# Patient Record
Sex: Male | Born: 1995 | Race: Black or African American | Hispanic: No | Marital: Single | Smoking: Never smoker
Health system: Southern US, Community
[De-identification: ages and names within clinical notes are randomized; demographics above are authoritative.]

## PROBLEM LIST (undated history)

## (undated) DIAGNOSIS — J189 Pneumonia, unspecified organism: Secondary | ICD-10-CM

## (undated) HISTORY — PX: APPENDECTOMY: SHX54

---
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
## 2016-01-28 ENCOUNTER — Ambulatory Visit (INDEPENDENT_AMBULATORY_CARE_PROVIDER_SITE_OTHER): Payer: Medicaid Other

## 2016-01-28 ENCOUNTER — Ambulatory Visit (HOSPITAL_COMMUNITY)
Admission: EM | Admit: 2016-01-28 | Discharge: 2016-01-28 | Disposition: A | Discharge status: Home / Self Care | Payer: Medicaid Other | Attending: Emergency Medicine | Admitting: Emergency Medicine

## 2016-01-28 ENCOUNTER — Encounter (HOSPITAL_COMMUNITY): Payer: Self-pay | Admitting: Emergency Medicine

## 2016-01-28 DIAGNOSIS — R1111 Vomiting without nausea: Secondary | ICD-10-CM | POA: Diagnosis not present

## 2016-01-28 DIAGNOSIS — R141 Gas pain: Secondary | ICD-10-CM

## 2016-01-28 DIAGNOSIS — R1110 Vomiting, unspecified: Secondary | ICD-10-CM

## 2016-01-28 MED ORDER — METOCLOPRAMIDE HCL 10 MG PO TABS: 10.0000 mg | ORAL_TABLET | Freq: Three times a day (TID) | ORAL | 0 refills | Status: DC

## 2016-01-28 NOTE — ED Provider Notes (Signed)
CSN: [id number]     Arrival date & time 01/28/16  1230 History   First MD Initiated Contact with Patient 01/28/16 1442     No chief complaint on file.  (Consider location/radiation/quality/duration/timing/severity/associated sxs/prior Treatment) 20 year old male states for the past 2 days he has had sudden regurgitation of food in which she also causes vomiting proximal mid 5 minutes after eating a meal. After this occurs he has pain in the mid and left chest that radiates to the upper abdomen. This last for several minutes before gradually resolving. This has happened several times in the past 2 days. He states he had does not otherwise feel sick. No fatigue, malaise, fever, nausea.      Past Medical History:  Diagnosis Date  . Pneumonia    Past Surgical History:  Procedure Laterality Date  . APPENDECTOMY     Family History  Problem Relation Age of Onset  . Hypertension Mother    Social History  Substance Use Topics  . Smoking status: Never Smoker  . Smokeless tobacco: Not on file  . Alcohol use No    Review of Systems  Constitutional: Negative.   Respiratory: Negative for cough and shortness of breath.   Cardiovascular: Positive for chest pain.  Gastrointestinal: Positive for abdominal pain. Negative for abdominal distention, constipation and nausea.       As per history of present illness States last bowel movement was 2:00 morning.  Genitourinary: Negative.   Musculoskeletal: Negative.   Skin: Negative.   Neurological: Positive for headaches.    Allergies  Review of patient's allergies indicates no known allergies.  Home Medications   Prior to Admission medications   Medication Sig Start Date End Date Taking? Authorizing Provider  cetirizine (ZYRTEC) 10 MG tablet Take 1 tablet (10 mg total) by mouth daily. One tab daily for allergies 07/05/13   Linna HoffJames D Kindl, MD  fluticasone [facility](FLONASE) 50 MCG/ACT nasal spray Place 1 spray into both nostrils 2 (two) times daily.  07/05/13   Linna HoffJames D Kindl, MD  ibuprofen (ADVIL,MOTRIN) 600 MG tablet Take 1 tablet (600 mg total) by mouth every 6 (six) hours as needed for pain. 12/28/12   Earley FavorGail Schulz, NP  metoCLOPramide (REGLAN) 10 MG tablet Take 1 tablet (10 mg total) by mouth 3 (three) times daily before meals. 01/28/16   Hayden Rasmussenavid Sha Burling, NP   Meds Ordered and Administered this Visit  Medications - No data to display  BP 122/67 (BP Location: Left Arm)   Pulse 70   Temp 98.2 F (36.8 C) (Oral)   Resp 14   SpO2 99%  No data found.   Physical Exam  Constitutional: He is oriented to person, place, and time. He appears well-developed and well-nourished. No distress.  HENT:  Head: Normocephalic and atraumatic.  Eyes: EOM are normal.  Neck: Normal range of motion. Neck supple.  Cardiovascular: Normal rate, regular rhythm and normal heart sounds.   Pulmonary/Chest: Effort normal and breath sounds normal. No respiratory distress. He has no wheezes.  Abdominal: He exhibits no distension. There is no rebound and no guarding. No hernia.  Abdomen flat, soft but firm. Bowel sounds are normal. No distention. Percusses dull to flat in most areas. Tympanic in the high epigastrium. No rebound or guarding there is some generalized tenderness. More specific tenderness I epigastrium.  Musculoskeletal: He exhibits no edema.  Neurological: He is alert and oriented to person, place, and time. He exhibits normal muscle tone.  Skin: Skin is warm and dry.  Psychiatric: He  has a normal mood and affect.  Nursing note and vitals reviewed.   Urgent Care Course   Clinical Course    Procedures (including critical care time)  Labs Review Labs Reviewed - No data to display  Imaging Review Dg Abd 2 Views  Result Date: 01/28/2016 CLINICAL DATA:  Vomiting EXAM: ABDOMEN - 2 VIEW COMPARISON:  08/14/2009 FINDINGS: Nonobstructive bowel gas pattern. No evidence of free air under the diaphragm on the upright view. Visualized osseous structures are  within normal limits. IMPRESSION: No evidence of small bowel obstruction or free air. Electronically Signed   By: Charline BillsSriyesh  Krishnan M.D.   On: 01/28/2016 15:14     Visual Acuity Review  Right Eye Distance:   Left Eye Distance:   Bilateral Distance:    Right Eye Near:   Left Eye Near:    Bilateral Near:         MDM   1. Non-intractable vomiting without nausea, unspecified vomiting type   2. Regurgitation of food   3. Abdominal gas pain    The reason for regurgitation of food soon after eating is uncertain. He may be having a partial blockage or narrowing in your upper esophageal and stomach tract. Until you see the gastroenterologist primarily drink fluids and when you eat most the soft foods in small amounts. Recommend not eating steak, tough meats or large meals. Must eat a small amount at a time allow it to digest wait 30 minutes to an hour and then you may eat a little more later. Take the medication is directed. If this continues and you have increased abdominal pain and unable to hold down food before seeing the gastroenterologist you should go to the emergency department. We do not have any additional testing here to help resolve her problem. In regard to the x-ray above there is moderate amount of stool and gas in the large bowel. The area of tenderness in the abdomen coincides with the location of the gas on x-ray. Patient will likely need swallowing function studies or endoscopy.    Hayden Rasmussenavid Brizza Nathanson, NP 01/28/16 [phone]

## 2016-01-28 NOTE — ED Triage Notes (Signed)
For 2 days, patient reports food will not stay down, patient reports vomiting after eating.  Patient denies any other issue: does not feel bad, gets hungry, starts eating and then has to vomit and then feels chest tighten.  Patient is holding down liquids.  Normal bm around 2:00 am

## 2016-01-28 NOTE — Discharge Instructions (Signed)
The reason for regurgitation of food soon after eating is uncertain. He may be having a partial blockage or narrowing in your upper esophageal and stomach tract. Until you see the gastroenterologist primarily drink fluids and when you eat most the soft foods in small amounts. Recommend not eating steak, tough meats or large meals. Must eat a small amount at a time allow it to digest wait 30 minutes to an hour and then you may eat a little more later. Take the medication is directed. If this continues and you have increased abdominal pain and unable to hold down food before seeing the gastroenterologist you should go to the emergency department. We do not have any additional testing here to help resolve her problem.

## 2017-03-29 ENCOUNTER — Other Ambulatory Visit: Payer: Self-pay

## 2017-03-29 ENCOUNTER — Ambulatory Visit (HOSPITAL_COMMUNITY)
Admission: EM | Admit: 2017-03-29 | Discharge: 2017-03-29 | Disposition: A | Discharge status: Home / Self Care | Payer: Self-pay | Attending: Family Medicine | Admitting: Family Medicine

## 2017-03-29 ENCOUNTER — Encounter (HOSPITAL_COMMUNITY): Payer: Self-pay | Admitting: Emergency Medicine

## 2017-03-29 DIAGNOSIS — H538 Other visual disturbances: Secondary | ICD-10-CM

## 2017-03-29 LAB — GLUCOSE, CAPILLARY: Glucose-Capillary: 80 mg/dL (ref 65–99)

## 2017-03-29 NOTE — ED Triage Notes (Signed)
Pt c/o eye pain and headache for ac ouple weeks, no redness or drainage noted. Pt states it will get blury at times. At the moment no issues with vision.

## 2017-03-29 NOTE — Discharge Instructions (Signed)
I recommend that you see an ophthalmologist for a thorough eye exam.  Your blood sugar today was normal.

## 2017-04-02 NOTE — ED Provider Notes (Signed)
  [facility]   [id number] 03/29/17 Arrival Time: 1408  ASSESSMENT & PLAN:  1. Blurry vision, right eye    Is not currently experiencing symptoms. Unsure of exact etiology. At this time I do not feel this is anything dangerous but I do recommend that he first receive a prompt and thorough eye exam from an ophthalmologist. He reports that he will schedule. If any acute worsening he will proceed to the ED for evaluation.  Reviewed expectations re: course of current medical issues. Questions answered. Outlined signs and symptoms indicating need for more acute intervention. Patient verbalized understanding. After Visit Summary given.   SUBJECTIVE: History from: patient. Jimmy Powers is a 22 y.o. male who presents with complaint of on and off blurry vision of his R eye. Occasional frontal headache associated but not always paired with blurry vision. Does not wear corrective lenses. Vision currently normal. No eye pain, redness, or drainage. Symptoms infrequent over the past 2 weeks. When present may last several minutes then resolve. No new medications.  ROS: As per HPI.   OBJECTIVE:  Vitals:   03/29/17 1537  BP: 120/63  Pulse: 84  Resp: 16  Temp: 98.6 F (37 C)  SpO2: 100%    General appearance: alert; no distress Eyes: PERRLA; EOMI; conjunctiva normal HENT: normocephalic; atraumatic; TMs normal; nasal mucosa normal; oral mucosa normal Neck: supple  Lungs: clear to auscultation bilaterally Heart: regular rate and rhythm Extremities: no cyanosis or edema; symmetrical with no gross deformities Skin: warm and dry Psychological: alert and cooperative; normal mood and affect  No Known Allergies  Past Medical History:  Diagnosis Date  . Pneumonia    Social History   Socioeconomic History  . Marital status: Single    Spouse name: Not on file  . Number of children: Not on file  . Years of education: Not on file  . Highest education level: Not on file  Social  Needs  . Financial resource strain: Not on file  . Food insecurity - worry: Not on file  . Food insecurity - inability: Not on file  . Transportation needs - medical: Not on file  . Transportation needs - non-medical: Not on file  Occupational History  . Not on file  Tobacco Use  . Smoking status: Never Smoker  Substance and Sexual Activity  . Alcohol use: No  . Drug use: No  . Sexual activity: No  Other Topics Concern  . Not on file  Social History Narrative  . Not on file   Family History  Problem Relation Age of Onset  . Hypertension Mother    Past Surgical History:  Procedure Laterality Date  . Christy GentlesAPPENDECTOMY       Jimmy Beagley, MD 04/02/17 [phone]

## 2018-03-24 IMAGING — DX DG ABDOMEN 2V
2 series · 2 of 2 positions shown · non-contrast
Comparison: 08/14/2009

CLINICAL DATA: Vomiting

EXAM:
ABDOMEN - 2 VIEW

[abdomen erect]
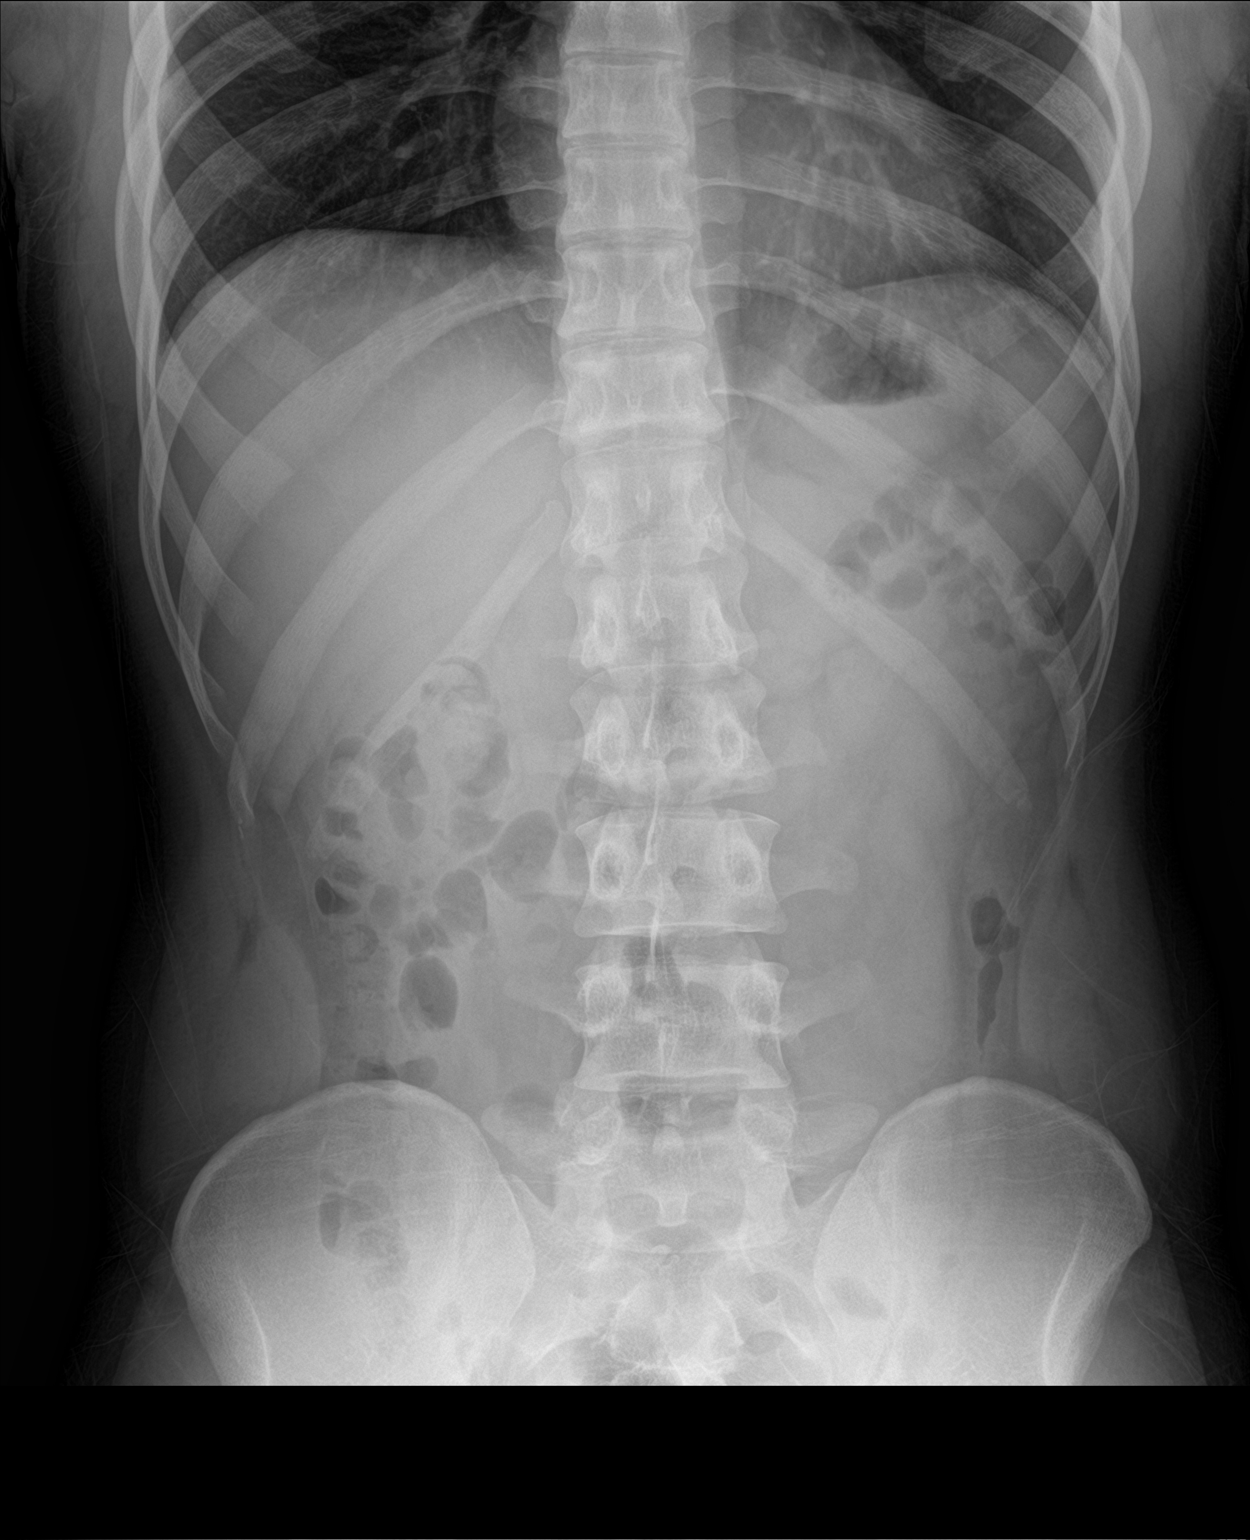

[abdomen supine]
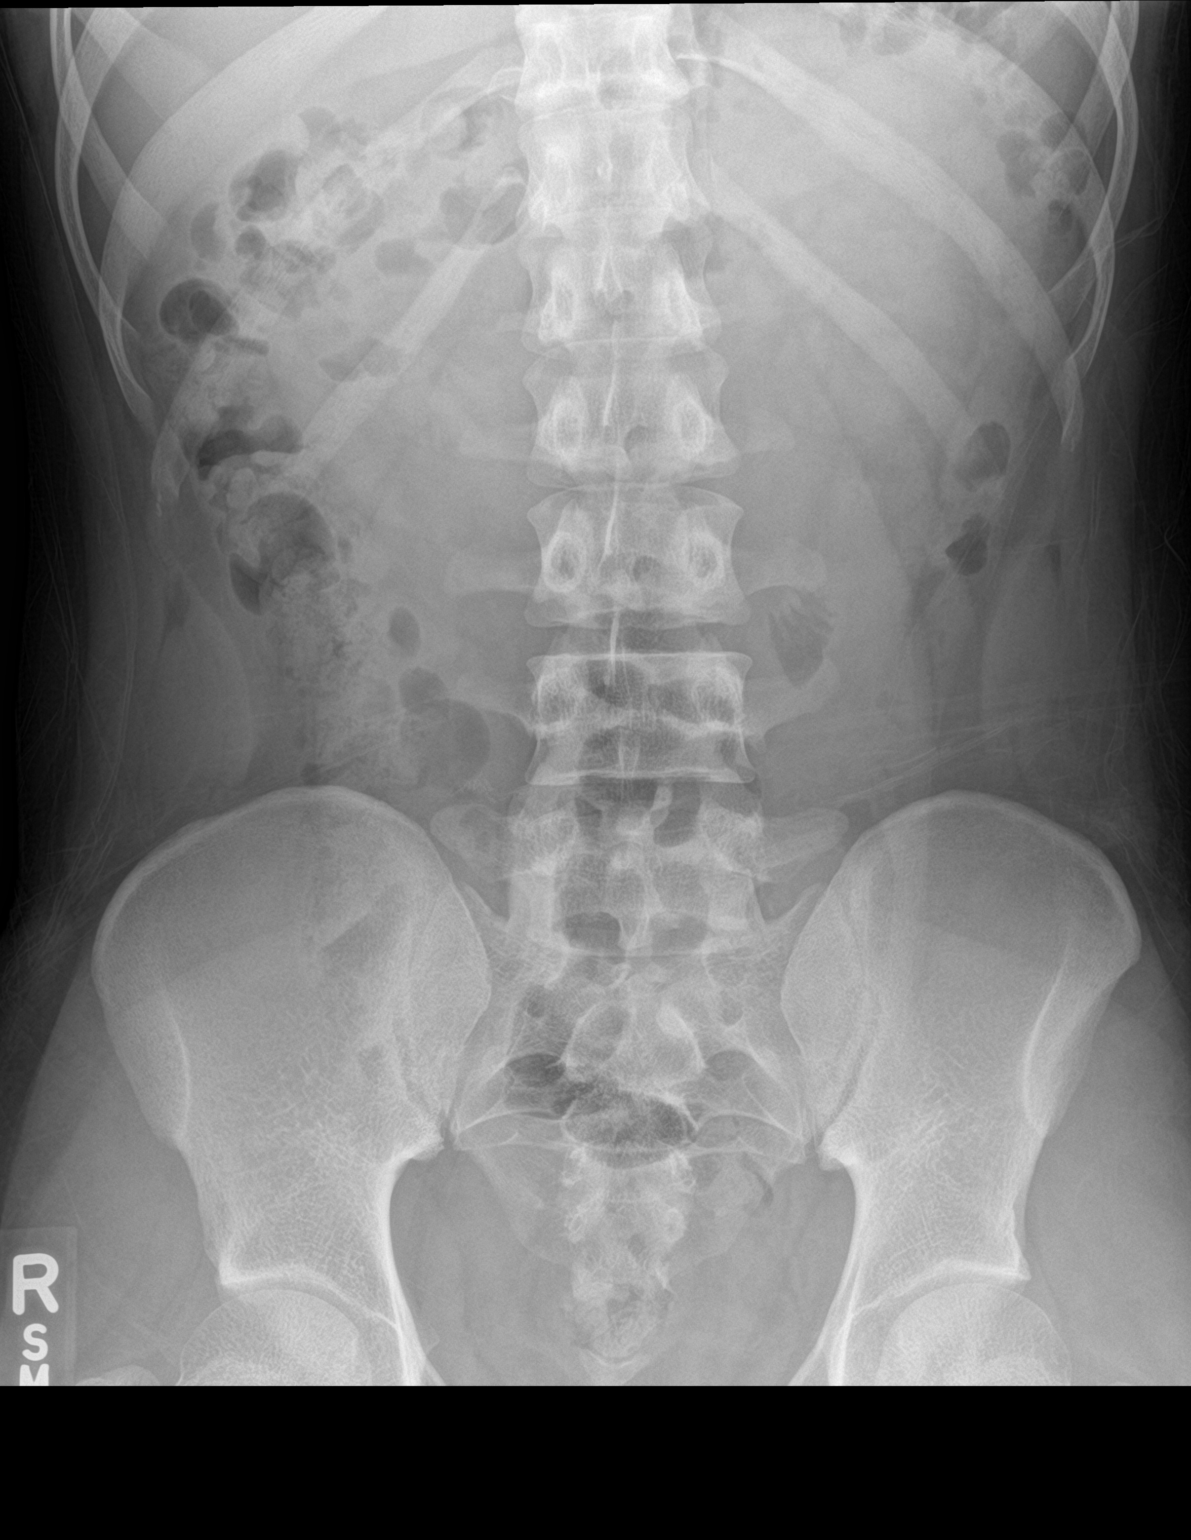

[2 of 2 positions shown; findings below may reference images not displayed]

FINDINGS: Nonobstructive bowel gas pattern.

No evidence of free air under the diaphragm on the upright view.

Visualized osseous structures are within normal limits.
IMPRESSION: No evidence of small bowel obstruction or free air.

## 2018-05-05 ENCOUNTER — Other Ambulatory Visit: Payer: Self-pay

## 2018-05-05 ENCOUNTER — Encounter (HOSPITAL_COMMUNITY): Payer: Self-pay | Admitting: Emergency Medicine

## 2018-05-05 ENCOUNTER — Ambulatory Visit (HOSPITAL_COMMUNITY)
Admission: EM | Admit: 2018-05-05 | Discharge: 2018-05-05 | Disposition: A | Discharge status: Home / Self Care | Payer: Self-pay | Attending: Family Medicine | Admitting: Family Medicine

## 2018-05-05 DIAGNOSIS — J111 Influenza due to unidentified influenza virus with other respiratory manifestations: Secondary | ICD-10-CM

## 2018-05-05 DIAGNOSIS — R69 Illness, unspecified: Secondary | ICD-10-CM

## 2018-05-05 MED ORDER — FLUTICASONE PROPIONATE 50 MCG/ACT NA SUSP: 1.0000 | Freq: Every day | NASAL | 0 refills | Status: DC

## 2018-05-05 MED ORDER — PSEUDOEPH-BROMPHEN-DM 30-2-10 MG/5ML PO SYRP: 5.0000 mL | ORAL_SOLUTION | Freq: Four times a day (QID) | ORAL | 0 refills | Status: DC | PRN

## 2018-05-05 MED ORDER — ACETAMINOPHEN 325 MG PO TABS
650.0000 mg | ORAL_TABLET | Freq: Once | ORAL | Status: AC
  Administered 2018-05-05: 650 mg via ORAL

## 2018-05-05 MED ORDER — ACETAMINOPHEN 325 MG PO TABS
ORAL_TABLET | ORAL | Status: AC
  Filled 2018-05-05: qty 2

## 2018-05-05 MED ORDER — IBUPROFEN 800 MG PO TABS: 800.0000 mg | ORAL_TABLET | Freq: Three times a day (TID) | ORAL | 0 refills | Status: DC

## 2018-05-05 MED ORDER — CETIRIZINE HCL 10 MG PO CAPS: 10.0000 mg | ORAL_CAPSULE | Freq: Every day | ORAL | 0 refills | Status: DC

## 2018-05-05 NOTE — ED Triage Notes (Signed)
Onset of symptoms on Saturday.  Patient has body aches, coughing, sneezing, and headache.  Intermittent diarrhea

## 2018-05-05 NOTE — Discharge Instructions (Signed)
You likely having a viral upper respiratory infection. We recommended symptom control. I expect your symptoms to start improving in the next 1-2 weeks.   1. Take a daily allergy pill/anti-histamine like Zyrtec, Claritin, or Store brand consistently for 2 weeks  2. For congestion you may try an oral decongestant like Mucinex or sudafed. You may also try intranasal flonase nasal spray or saline irrigations (neti pot, sinus cleanse)  3. For your sore throat you may try cepacol lozenges, salt water gargles, throat spray. Treatment of congestion may also help your sore throat.  4. For cough you may try Cough syrup provided, delsym, Robitussen, Mucinex DM  5. Take Tylenol or Ibuprofen to help with pain/inflammation  6. Stay hydrated, drink plenty of fluids to keep throat coated and less irritated  Honey Tea For cough/sore throat try using a honey-based tea. Use 3 teaspoons of honey with juice squeezed from half lemon. Place shaved pieces of ginger into 1/2-1 cup of water and warm over stove top. Then mix the ingredients and repeat every 4 hours as needed.

## 2018-05-06 NOTE — ED Provider Notes (Signed)
[facility]    CSN: [id number] Arrival date & time: 05/05/18  1904     History   Chief Complaint Chief Complaint  Patient presents with  . URI    HPI Jimmy Powers is a 23 y.o. male no contributing past medical history presenting today for evaluation of URI symptoms.  Patient states that his symptoms began on Saturday, approximately 4 days ago.  He has been experiencing cough, congestion, sneezing, headaches, body aches, intermittent diarrhea.  He has tried ibuprofen as well as [place] DM for his cough without relief.  He is also noted fevers.  Denies chest pain or shortness of breath.  HPI  Past Medical History:  Diagnosis Date  . Pneumonia     There are no active problems to display for this patient.   Past Surgical History:  Procedure Laterality Date  . APPENDECTOMY         Home Medications    Prior to Admission medications   Medication Sig Start Date End Date Taking? Authorizing Provider  brompheniramine-pseudoephedrine-DM 30-2-10 MG/5ML syrup Take 5 mLs by mouth 4 (four) times daily as needed. 05/05/18   Fabiha Rougeau C, PA-C  Cetirizine HCl 10 MG CAPS Take 1 capsule (10 mg total) by mouth daily for 10 days. 05/05/18 05/15/18  Kelleigh Skerritt C, PA-C  fluticasone (FLONASE) 50 MCG/ACT nasal spray Place 1-2 sprays into both nostrils daily for 7 days. 05/05/18 05/12/18  Lynett Brasil C, PA-C  ibuprofen (ADVIL,MOTRIN) 800 MG tablet Take 1 tablet (800 mg total) by mouth 3 (three) times daily. 05/05/18   Spike Desilets, Junius Creamer, PA-C    Family History Family History  Problem Relation Age of Onset  . Hypertension Mother     Social History Social History   Tobacco Use  . Smoking status: Never Smoker  Substance Use Topics  . Alcohol use: No  . Drug use: No     Allergies   Patient has no known allergies.   Review of Systems Review of Systems  Constitutional: Positive for appetite change, chills, fatigue and fever. Negative for activity change.  HENT:  Positive for congestion and rhinorrhea. Negative for ear pain, sinus pressure, sore throat and trouble swallowing.   Eyes: Negative for discharge and redness.  Respiratory: Positive for cough. Negative for chest tightness and shortness of breath.   Cardiovascular: Negative for chest pain.  Gastrointestinal: Positive for diarrhea. Negative for abdominal pain, nausea and vomiting.  Musculoskeletal: Positive for myalgias.  Skin: Negative for rash.  Neurological: Positive for headaches. Negative for dizziness and light-headedness.     Physical Exam Triage Vital Signs ED Triage Vitals  Enc Vitals Group     BP 05/05/18 1950 (!) 123/58     Pulse Rate 05/05/18 1950 (!) 104     Resp 05/05/18 1950 18     Temp 05/05/18 1950 (!) 100.6 F (38.1 C)     Temp Source 05/05/18 1950 Temporal     SpO2 05/05/18 1950 96 %     Weight --      Height --      Head Circumference --      Peak Flow --      Pain Score 05/05/18 1948 8     Pain Loc --      Pain Edu? --      Excl. in GC? --    No data found.  Updated Vital Signs BP (!) 123/58 (BP Location: Right Arm)   Pulse (!) 104   Temp (!) 100.6 F (  38.1 C) (Temporal)   Resp 18   SpO2 96%   Visual Acuity Right Eye Distance:   Left Eye Distance:   Bilateral Distance:    Right Eye Near:   Left Eye Near:    Bilateral Near:     Physical Exam Vitals signs and nursing note reviewed.  Constitutional:      Appearance: He is well-developed.  HENT:     Head: Normocephalic and atraumatic.     Ears:     Comments: Bilateral ears without tenderness to palpation of external auricle, tragus and mastoid, EAC's without erythema or swelling, TM's with good bony landmarks and cone of light. Non erythematous.    Mouth/Throat:     Comments: Oral mucosa pink and moist, no tonsillar enlargement or exudate. Posterior pharynx patent and nonerythematous, no uvula deviation or swelling. Normal phonation. Eyes:     Conjunctiva/sclera: Conjunctivae normal.  Neck:      Musculoskeletal: Neck supple.  Cardiovascular:     Rate and Rhythm: Normal rate and regular rhythm.     Heart sounds: No murmur.  Pulmonary:     Effort: Pulmonary effort is normal. No respiratory distress.     Breath sounds: Normal breath sounds.     Comments: Breathing comfortably at rest, CTABL, no wheezing, rales or other adventitious sounds auscultated Abdominal:     Palpations: Abdomen is soft.     Tenderness: There is no abdominal tenderness.  Skin:    General: Skin is warm and dry.  Neurological:     Mental Status: He is alert.      UC Treatments / Results  Labs (all labs ordered are listed, but only abnormal results are displayed) Labs Reviewed - No data to display  EKG None  Radiology No results found.  Procedures Procedures (including critical care time)  Medications Ordered in UC Medications  acetaminophen (TYLENOL) tablet 650 mg (650 mg Oral Given 05/05/18 1957)    Initial Impression / Assessment and Plan / UC Course  I have reviewed the triage vital signs and the nursing notes.  Pertinent labs & imaging results that were available during my care of the patient were reviewed by me and considered in my medical decision making (see chart for details).     Patient with fever, exam nonfocal, less likely influenza-like illness.  Patient outside of Tamiflu window.  Will recommend continued symptomatic and supportive care at this time.  Lungs clear, do not suspect underlying pneumonia at this time.  Push fluids, rest, symptomatic recommendations below.  Continue to monitor breathing, temperature and symptoms,Discussed strict return precautions. Patient verbalized understanding and is agreeable with plan.  Final Clinical Impressions(s) / UC Diagnoses   Final diagnoses:  Influenza-like illness     Discharge Instructions     You likely having a viral upper respiratory infection. We recommended symptom control. I expect your symptoms to start improving in  the next 1-2 weeks.   1. Take a daily allergy pill/anti-histamine like Zyrtec, Claritin, or Store brand consistently for 2 weeks  2. For congestion you may try an oral decongestant like Mucinex or sudafed. You may also try intranasal flonase nasal spray or saline irrigations (neti pot, sinus cleanse)  3. For your sore throat you may try cepacol lozenges, salt water gargles, throat spray. Treatment of congestion may also help your sore throat.  4. For cough you may try Cough syrup provided, delsym, Robitussen, Mucinex DM  5. Take Tylenol or Ibuprofen to help with pain/inflammation  6. Stay hydrated, drink  plenty of fluids to keep throat coated and less irritated  Honey Tea For cough/sore throat try using a honey-based tea. Use 3 teaspoons of honey with juice squeezed from half lemon. Place shaved pieces of ginger into 1/2-1 cup of water and warm over stove top. Then mix the ingredients and repeat every 4 hours as needed.   ED Prescriptions    Medication Sig Dispense Auth. Provider   ibuprofen (ADVIL,MOTRIN) 800 MG tablet Take 1 tablet (800 mg total) by mouth 3 (three) times daily. 21 tablet Rohil Lesch C, PA-C   fluticasone (FLONASE) 50 MCG/ACT nasal spray Place 1-2 sprays into both nostrils daily for 7 days. 1 g Lesa Vandall C, PA-C   Cetirizine HCl 10 MG CAPS Take 1 capsule (10 mg total) by mouth daily for 10 days. 10 capsule Amaris Delafuente C, PA-C   brompheniramine-pseudoephedrine-DM 30-2-10 MG/5ML syrup Take 5 mLs by mouth 4 (four) times daily as needed. 120 mL Asuncion Shibata C, PA-C     Controlled Substance Prescriptions [place] Controlled Substance Registry consulted? Not Applicable   Lew Dawes, [place] 05/06/18 1105

## 2019-07-06 ENCOUNTER — Encounter (HOSPITAL_COMMUNITY): Payer: Self-pay

## 2019-07-06 ENCOUNTER — Other Ambulatory Visit: Payer: Self-pay

## 2019-07-06 ENCOUNTER — Ambulatory Visit (HOSPITAL_COMMUNITY)
Admission: EM | Admit: 2019-07-06 | Discharge: 2019-07-06 | Disposition: A | Discharge status: Home / Self Care | Payer: Self-pay | Attending: Urgent Care | Admitting: Urgent Care

## 2019-07-06 ENCOUNTER — Ambulatory Visit (INDEPENDENT_AMBULATORY_CARE_PROVIDER_SITE_OTHER): Payer: Self-pay

## 2019-07-06 DIAGNOSIS — M545 Low back pain, unspecified: Secondary | ICD-10-CM

## 2019-07-06 DIAGNOSIS — M62830 Muscle spasm of back: Secondary | ICD-10-CM

## 2019-07-06 DIAGNOSIS — M546 Pain in thoracic spine: Secondary | ICD-10-CM

## 2019-07-06 DIAGNOSIS — R0789 Other chest pain: Secondary | ICD-10-CM

## 2019-07-06 DIAGNOSIS — S39012A Strain of muscle, fascia and tendon of lower back, initial encounter: Secondary | ICD-10-CM

## 2019-07-06 MED ORDER — TIZANIDINE HCL 4 MG PO TABS: 4.0000 mg | ORAL_TABLET | Freq: Four times a day (QID) | ORAL | 0 refills | Status: DC | PRN

## 2019-07-06 MED ORDER — NAPROXEN 500 MG PO TABS
500.0000 mg | ORAL_TABLET | Freq: Two times a day (BID) | ORAL | 0 refills | Status: DC
Start: 2019-07-06 — End: 2020-06-08

## 2019-07-06 NOTE — ED Provider Notes (Signed)
[place]   MRN: [id number] DOB: 1996-03-18  Subjective:   Jimmy Powers is a 24 y.o. male presenting for 2-day history of acute onset bilateral back pain that starts at the upper part of his thoracic back all the way down to his low back.  Symptoms are worse over low back, moderate to severe nature.  He is also having a difficult time taking a deep breath or sitting up straight.  States that he has left lower lateral chest pain.  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  Tries to hydrate very well.  Has used ibuprofen with minimal relief.  Denies falls, trauma.  No current facility-administered medications for this encounter.  Current Outpatient Medications:  .  brompheniramine-pseudoephedrine-DM 30-2-10 MG/5ML syrup, Take 5 mLs by mouth 4 (four) times daily as needed., Disp: 120 mL, Rfl: 0 .  Cetirizine HCl 10 MG CAPS, Take 1 capsule (10 mg total) by mouth daily for 10 days., Disp: 10 capsule, Rfl: 0 .  fluticasone (FLONASE) 50 MCG/ACT nasal spray, Place 1-2 sprays into both nostrils daily for 7 days., Disp: 1 g, Rfl: 0 .  ibuprofen (ADVIL,MOTRIN) 800 MG tablet, Take 1 tablet (800 mg total) by mouth 3 (three) times daily., Disp: 21 tablet, Rfl: 0   No Known Allergies  Past Medical History:  Diagnosis Date  . Pneumonia      Past Surgical History:  Procedure Laterality Date  . APPENDECTOMY      Family History  Problem Relation Age of Onset  . Hypertension Mother     Social History   Tobacco Use  . Smoking status: Never Smoker  . Smokeless tobacco: Never Used  Substance Use Topics  . Alcohol use: No  . Drug use: No    ROS   Objective:   Vitals: BP 124/69 (BP Location: Right Arm)   Pulse 69   Temp 97.9 F (36.6 C) (Oral)   Resp 17   SpO2 100%   Physical Exam Constitutional:    General: He is not in acute distress.    Appearance: Normal appearance. He is well-developed. He is not ill-appearing, toxic-appearing or diaphoretic.  HENT:     Head: Normocephalic and atraumatic.     Right Ear: External ear normal.     Left Ear: External ear normal.     Nose: Nose normal.     Mouth/Throat:     Mouth: Mucous membranes are moist.     Pharynx: Oropharynx is clear.  Eyes:     General: No scleral icterus.    Extraocular Movements: Extraocular movements intact.     Pupils: Pupils are equal, round, and reactive to light.  Cardiovascular:     Rate and Rhythm: Normal rate and regular rhythm.     Heart sounds: Normal heart sounds. No murmur. No friction rub. No gallop.   Pulmonary:     Effort: Pulmonary effort is normal. No respiratory distress.     Breath sounds: Normal breath sounds. No stridor. No wheezing, rhonchi or rales.  Chest:     Chest wall: Tenderness (over area outlined without deformity or ecchymosis) present.    Musculoskeletal:     Comments: Reports that he feels more comfortable in a slouched over position.  Finds it easier to breathe this way.  Has tenderness along paraspinal muscles worse over lumbar region.  No midline  spinal tenderness.  Neurological:     Mental Status: He is alert and oriented to person, place, and time.  Psychiatric:        Mood and Affect: Mood normal.        Behavior: Behavior normal.        Thought Content: Thought content normal.     DG Chest 2 View  Result Date: 07/06/2019 CLINICAL DATA:  Atypical chest pain EXAM: CHEST - 2 VIEW COMPARISON:  11/03/2010 FINDINGS: The heart size and mediastinal contours are within normal limits. Both lungs are clear. The visualized skeletal structures are unremarkable. IMPRESSION: No acute abnormality of the lungs. Electronically Signed   By: Lauralyn Primes M.D.   On: 07/06/2019 13:05    Assessment and Plan :   1. Acute bilateral low back pain without sciatica   2. Strain of lumbar region,  initial encounter   3. Acute bilateral thoracic back pain   4. Muscle spasm of back   5. Atypical chest pain     Use conservative management for musculoskeletal type pain, lumbar strain.  Start combination of naproxen, tizanidine.  Recommended rest from work, work restrictions. Counseled patient on potential for adverse effects with medications prescribed/recommended today, ER and return-to-clinic precautions discussed, patient verbalized understanding.    Wallis Bamberg, PA-C 07/06/19 1435

## 2019-07-06 NOTE — ED Triage Notes (Signed)
Pt presents to UC with back pain x 2 days. Pt states the pain is worse when he takes a deep breath or moves his shoulders.

## 2019-07-23 ENCOUNTER — Other Ambulatory Visit: Payer: Self-pay

## 2019-07-23 ENCOUNTER — Ambulatory Visit (HOSPITAL_COMMUNITY)
Admission: EM | Admit: 2019-07-23 | Discharge: 2019-07-23 | Disposition: A | Discharge status: Home / Self Care | Payer: Medicaid Other | Attending: Physician Assistant | Admitting: Physician Assistant

## 2019-07-23 ENCOUNTER — Encounter (HOSPITAL_COMMUNITY): Payer: Self-pay

## 2019-07-23 DIAGNOSIS — R6883 Chills (without fever): Secondary | ICD-10-CM | POA: Diagnosis not present

## 2019-07-23 DIAGNOSIS — Z20822 Contact with and (suspected) exposure to covid-19: Secondary | ICD-10-CM | POA: Insufficient documentation

## 2019-07-23 DIAGNOSIS — A084 Viral intestinal infection, unspecified: Secondary | ICD-10-CM | POA: Diagnosis present

## 2019-07-23 DIAGNOSIS — Z79899 Other long term (current) drug therapy: Secondary | ICD-10-CM | POA: Diagnosis not present

## 2019-07-23 MED ORDER — ONDANSETRON HCL 4 MG PO TABS: 4.0000 mg | ORAL_TABLET | Freq: Four times a day (QID) | ORAL | 0 refills | Status: DC

## 2019-07-23 NOTE — Discharge Instructions (Signed)
Use the Zofran for your nausea Drink at least 100 ounces of water and include at least one electrolyte beverage a day whether this is sports drink or Pedialyte, Pedialyte would be preferable it is less sugar -Eat small meals and avoid greasy foods  Take Tylenol or Advil for your headache  If your diarrhea is gradually slowing down this is a good thing.  If you find yourself needing to use the restroom frequently through the night you may consider taking 1 Imodium.  If your Covid-19 test is positive, you will receive a phone call from [facility] regarding your results. Negative test results are not called. Both positive and negative results area always visible on MyChart. If you do not have a MyChart account, sign up instructions are in your discharge papers.   Persons who are directed to care for themselves at home may discontinue isolation under the following conditions:   At least 10 days have passed since symptom onset and  At least 24 hours have passed without running a fever (this means without the use of fever-reducing medications) and  Other symptoms have improved.  Persons infected with COVID-19 who never develop symptoms may discontinue isolation and other precautions 10 days after the date of their first positive COVID-19 test.

## 2019-07-23 NOTE — ED Provider Notes (Signed)
[place]    CSN: [id number] Arrival date & time: 07/23/19  1749      History   Chief Complaint Chief Complaint  Patient presents with  . Diarrhea    HPI Jimmy Powers is a 24 y.o. male.   Patient presents for evaluation of abdominal pain and diarrhea that started early this morning.  He reports several episodes of watery diarrhea throughout the day.  He has had abdominal cramping is associated with this diarrhea.  There is been no blood in the stool.  He reports having a headache throughout the day that is frontal.  He has felt some chills.  Denies fever but endorses some body ache.  He has had some mild nausea however has not vomited.  Denies cough, runny nose, sore throat.  He reports diarrhea has gradually slowed down somewhat this evening.  He does report eating Janine Limbo yesterday evening.  He reports his girlfriend and his girlfriend sister recently had a viral GI illness.     Past Medical History:  Diagnosis Date  . Pneumonia     There are no problems to display for this patient.   Past Surgical History:  Procedure Laterality Date  . APPENDECTOMY         Home Medications    Prior to Admission medications   Medication Sig Start Date End Date Taking? Authorizing Provider  brompheniramine-pseudoephedrine-DM 30-2-10 MG/5ML syrup Take 5 mLs by mouth 4 (four) times daily as needed. 05/05/18   Wieters, Hallie C, PA-C  Cetirizine HCl 10 MG CAPS Take 1 capsule (10 mg total) by mouth daily for 10 days. 05/05/18 05/15/18  Wieters, Hallie C, PA-C  fluticasone (FLONASE) 50 MCG/ACT nasal spray Place 1-2 sprays into both nostrils daily for 7 days. 05/05/18 05/12/18  Wieters, Hallie C, PA-C  ibuprofen (ADVIL,MOTRIN) 800 MG tablet Take 1 tablet (800 mg total) by mouth 3 (three) times daily. 05/05/18   Wieters, Hallie C, PA-C  naproxen (NAPROSYN) 500 MG tablet Take 1 tablet (500 mg total) by mouth 2 (two) times daily. 07/06/19   Jaynee Eagles, PA-C  ondansetron (ZOFRAN) 4 MG  tablet Take 1 tablet (4 mg total) by mouth every 6 (six) hours. 07/23/19   Arron Mcnaught, Marguerita Beards, PA-C  tiZANidine (ZANAFLEX) 4 MG tablet Take 1 tablet (4 mg total) by mouth every 6 (six) hours as needed for muscle spasms. 07/06/19   Jaynee Eagles, PA-C    Family History Family History  Problem Relation Age of Onset  . Hypertension Mother     Social History Social History   Tobacco Use  . Smoking status: Never Smoker  . Smokeless tobacco: Never Used  Substance Use Topics  . Alcohol use: No  . Drug use: No     Allergies   Patient has no known allergies.   Review of Systems Review of Systems   Physical Exam Triage Vital Signs ED Triage Vitals [07/23/19 1850]  Enc Vitals Group     BP 127/78     Pulse Rate 78     Resp 16     Temp 98.4 F (36.9 C)     Temp src      SpO2 100 %     Weight      Height      Head Circumference      Peak Flow      Pain Score 7     Pain Loc      Pain Edu?      Excl. in [place]?  No data found.  Updated Vital Signs BP 127/78   Pulse 78   Temp 98.4 F (36.9 C)   Resp 16   SpO2 100%   Visual Acuity Right Eye Distance:   Left Eye Distance:   Bilateral Distance:    Right Eye Near:   Left Eye Near:    Bilateral Near:     Physical Exam Vitals and nursing note reviewed.  Constitutional:      Appearance: He is well-developed.  HENT:     Head: Normocephalic and atraumatic.     Mouth/Throat:     Mouth: Mucous membranes are moist.  Eyes:     Conjunctiva/sclera: Conjunctivae normal.  Cardiovascular:     Rate and Rhythm: Normal rate and regular rhythm.     Heart sounds: No murmur.  Pulmonary:     Effort: Pulmonary effort is normal. No respiratory distress.     Breath sounds: Normal breath sounds.  Abdominal:     General: Distension: generalized.     Palpations: Abdomen is soft.     Tenderness: There is abdominal tenderness. There is no right CVA tenderness or left CVA tenderness.  Musculoskeletal:     Cervical back: Neck supple.    Skin:    General: Skin is warm and dry.  Neurological:     Mental Status: He is alert.      UC Treatments / Results  Labs (all labs ordered are listed, but only abnormal results are displayed) Labs Reviewed  SARS CORONAVIRUS 2 (TAT 6-24 HRS)    EKG   Radiology No results found.  Procedures Procedures (including critical care time)  Medications Ordered in UC Medications - No data to display  Initial Impression / Assessment and Plan / UC Course  I have reviewed the triage vital signs and the nursing notes.  Pertinent labs & imaging results that were available during my care of the patient were reviewed by me and considered in my medical decision making (see chart for details).     #Viral enteritis Patient is a 23 year old male with symptoms consistent with a viral enteritis.  He does not seem clinically dehydrated at this point.  Abdominal exam is mostly reassuring for nonfocal etiology.  Discussed continued p.o. hydration and prescribed Zofran for sure he can continue to tolerate p.o. fluids.  Discussed consideration for beginning Imodium if diarrhea persists and is unable to sleep.  Tylenol or ibuprofen for headache.  Covid PCR was sent.  Follow-up and return precautions discussed ED precautions were discussed.  Patient verbalized understanding. Final Clinical Impressions(s) / UC Diagnoses   Final diagnoses:  Viral enteritis     Discharge Instructions     Use the Zofran for your nausea Drink at least 100 ounces of water and include at least one electrolyte beverage a day whether this is sports drink or Pedialyte, Pedialyte would be preferable it is less sugar -Eat small meals and avoid greasy foods  Take Tylenol or Advil for your headache  If your diarrhea is gradually slowing down this is a good thing.  If you find yourself needing to use the restroom frequently through the night you may consider taking 1 Imodium.  If your Covid-19 test is positive, you will  receive a phone call from [facility] regarding your results. Negative test results are not called. Both positive and negative results area always visible on MyChart. If you do not have a MyChart account, sign up instructions are in your discharge papers.   Persons who are directed  to care for themselves at home may discontinue isolation under the following conditions:  . At least 10 days have passed since symptom onset and . At least 24 hours have passed without running a fever (this means without the use of fever-reducing medications) and . Other symptoms have improved.  Persons infected with COVID-19 who never develop symptoms may discontinue isolation and other precautions 10 days after the date of their first positive COVID-19 test.     ED Prescriptions    Medication Sig Dispense Auth. Provider   ondansetron (ZOFRAN) 4 MG tablet Take 1 tablet (4 mg total) by mouth every 6 (six) hours. 6 tablet Kaylanni Ezelle, Veryl Speak, PA-C     PDMP not reviewed this encounter.   Hermelinda Medicus, PA-C 07/23/19 2310

## 2019-07-23 NOTE — ED Triage Notes (Signed)
Pt c/o diarrhea since last night after eating Dione [employer]over. Pt also exposed to stomach virus 2 days ago.

## 2019-07-24 LAB — SARS CORONAVIRUS 2 (TAT 6-24 HRS): SARS Coronavirus 2: NEGATIVE

## 2020-06-08 ENCOUNTER — Ambulatory Visit (HOSPITAL_COMMUNITY)
Admission: EM | Admit: 2020-06-08 | Discharge: 2020-06-08 | Disposition: A | Discharge status: Home / Self Care | Payer: Medicaid Other | Attending: Urgent Care | Admitting: Urgent Care

## 2020-06-08 ENCOUNTER — Other Ambulatory Visit: Payer: Self-pay

## 2020-06-08 ENCOUNTER — Encounter (HOSPITAL_COMMUNITY): Payer: Self-pay | Admitting: Emergency Medicine

## 2020-06-08 DIAGNOSIS — J019 Acute sinusitis, unspecified: Secondary | ICD-10-CM

## 2020-06-08 MED ORDER — CETIRIZINE HCL 10 MG PO TABS: 10.0000 mg | ORAL_TABLET | Freq: Every day | ORAL | 0 refills | Status: DC

## 2020-06-08 MED ORDER — PSEUDOEPHEDRINE HCL 30 MG PO TABS: 30.0000 mg | ORAL_TABLET | Freq: Three times a day (TID) | ORAL | 0 refills | Status: DC | PRN

## 2020-06-08 MED ORDER — BENZONATATE 100 MG PO CAPS: 100.0000 mg | ORAL_CAPSULE | Freq: Three times a day (TID) | ORAL | 0 refills | Status: DC | PRN

## 2020-06-08 MED ORDER — AMOXICILLIN 875 MG PO TABS: 875.0000 mg | ORAL_TABLET | Freq: Two times a day (BID) | ORAL | 0 refills | Status: DC

## 2020-06-08 MED ORDER — PROMETHAZINE-DM 6.25-15 MG/5ML PO SYRP: 5.0000 mL | ORAL_SOLUTION | Freq: Every evening | ORAL | 0 refills | Status: DC | PRN

## 2020-06-08 NOTE — ED Provider Notes (Signed)
[facility]   MRN: [id number] DOB: 03/09/1996  Subjective:   Jimmy Powers is a 25 y.o. male presenting for 3 week history of persistent coughing that has progressed to sinus congestion, right sided facial pain, headaches, upper dental pain. Has used APAP, Mucinex but has not really helped. Was tested for COVID 19 initially and was negative. No shob, chest pain, body aches. No smoking hx. No asthma. Has a history of pneumonia but this feels different.   No current facility-administered medications for this encounter.  Current Outpatient Medications:  .  brompheniramine-pseudoephedrine-DM 30-2-10 MG/5ML syrup, Take 5 mLs by mouth 4 (four) times daily as needed., Disp: 120 mL, Rfl: 0 .  Cetirizine HCl 10 MG CAPS, Take 1 capsule (10 mg total) by mouth daily for 10 days., Disp: 10 capsule, Rfl: 0 .  fluticasone (FLONASE) 50 MCG/ACT nasal spray, Place 1-2 sprays into both nostrils daily for 7 days., Disp: 1 g, Rfl: 0 .  ibuprofen (ADVIL,MOTRIN) 800 MG tablet, Take 1 tablet (800 mg total) by mouth 3 (three) times daily., Disp: 21 tablet, Rfl: 0 .  naproxen (NAPROSYN) 500 MG tablet, Take 1 tablet (500 mg total) by mouth 2 (two) times daily., Disp: 30 tablet, Rfl: 0 .  ondansetron (ZOFRAN) 4 MG tablet, Take 1 tablet (4 mg total) by mouth every 6 (six) hours., Disp: 6 tablet, Rfl: 0 .  tiZANidine (ZANAFLEX) 4 MG tablet, Take 1 tablet (4 mg total) by mouth every 6 (six) hours as needed for muscle spasms., Disp: 30 tablet, Rfl: 0   No Known Allergies  Past Medical History:  Diagnosis Date  . Pneumonia      Past Surgical History:  Procedure Laterality Date  . APPENDECTOMY      Family History  Problem Relation Age of Onset  . Hypertension Mother     Social History   Tobacco Use  . Smoking status: Never Smoker  . Smokeless tobacco: Never Used  Substance Use Topics  . Alcohol use: No  . Drug use: No    ROS   Objective:   Vitals: BP (!) 140/110 (BP Location:  Right Arm)   Pulse 80   Temp 98.9 F (37.2 C) (Oral)   Resp 17   SpO2 99%   BP Readings from Last 3 Encounters:  06/08/20 (!) 140/110  07/23/19 127/78  07/06/19 124/69   Physical Exam Constitutional:      General: He is not in acute distress.    Appearance: Normal appearance. He is well-developed and normal weight. He is not ill-appearing, toxic-appearing or diaphoretic.  HENT:     Head: Normocephalic and atraumatic.     Right Ear: Tympanic membrane, ear canal and external ear normal. There is no impacted cerumen.     Left Ear: Tympanic membrane, ear canal and external ear normal. There is no impacted cerumen.     Nose: No congestion or rhinorrhea.     Comments: Right sided facial pain, tenderness about the maxillary sinus.     Mouth/Throat:     Mouth: Mucous membranes are moist.     Pharynx: No oropharyngeal exudate or posterior oropharyngeal erythema.     Comments: Dentition without obvious caries, gingival erythema, dental infection. Eyes:     General: No scleral icterus.       Right eye: No discharge.        Left eye: No discharge.     Extraocular Movements: Extraocular movements intact.     Conjunctiva/sclera: Conjunctivae normal.  Pupils: Pupils are equal, round, and reactive to light.  Cardiovascular:     Rate and Rhythm: Normal rate and regular rhythm.     Heart sounds: Normal heart sounds. No murmur heard. No friction rub. No gallop.   Pulmonary:     Effort: Pulmonary effort is normal. No respiratory distress.     Breath sounds: Normal breath sounds. No stridor. No wheezing, rhonchi or rales.  Musculoskeletal:     Cervical back: Normal range of motion and neck supple. No rigidity. No muscular tenderness.  Neurological:     General: No focal deficit present.     Mental Status: He is alert and oriented to person, place, and time.  Psychiatric:        Mood and Affect: Mood normal.        Behavior: Behavior normal.        Thought Content: Thought content  normal.     Assessment and Plan :   PDMP not reviewed this encounter.  1. Acute sinusitis, recurrence not specified, unspecified location     Will start empiric treatment for sinusitis with amoxicillin.  Recommended supportive care otherwise including the use of oral antihistamine, decongestant. Counseled patient on potential for adverse effects with medications prescribed/recommended today, ER and return-to-clinic precautions discussed, patient verbalized understanding.    Wallis Bamberg, [place] 06/08/20 1559

## 2020-06-08 NOTE — ED Triage Notes (Signed)
Pt presents with Cough that started approx 3 weeks ago. States is currently having nasal congestion, facial pain, and headaches.   Took tylenol this AM but only received relief from headache.   States was tested for COVID when cough started with negative result.

## 2020-06-28 ENCOUNTER — Encounter (HOSPITAL_COMMUNITY): Payer: Self-pay

## 2020-06-28 ENCOUNTER — Other Ambulatory Visit: Payer: Self-pay

## 2020-06-28 ENCOUNTER — Ambulatory Visit (HOSPITAL_COMMUNITY)
Admission: EM | Admit: 2020-06-28 | Discharge: 2020-06-28 | Disposition: A | Discharge status: Home / Self Care | Payer: Medicaid Other | Attending: Emergency Medicine | Admitting: Emergency Medicine

## 2020-06-28 DIAGNOSIS — R21 Rash and other nonspecific skin eruption: Secondary | ICD-10-CM

## 2020-06-28 MED ORDER — PREDNISONE 10 MG (21) PO TBPK: ORAL_TABLET | Freq: Every day | ORAL | 0 refills | Status: DC

## 2020-06-28 NOTE — ED Provider Notes (Signed)
[facility]    CSN: [id number] Arrival date & time: 06/28/20  1238      History   Chief Complaint Chief Complaint  Patient presents with  . Rash    HPI Jimmy Powers is a 25 y.o. male.   Patient presents with pruritic red rash around the tattoos on his arms and legs x1 week.  No new tattoo; all tattoos have been present for >1 year.  He states that rash started after he started taking amoxicillin.  Patient was seen here on 06/08/2020; diagnosed with acute recurrent sinusitis; treated with amoxicillin.  He reports taking 5 days of the amoxicillin before he stopped it due to the rash; last dose taken 6 days ago.  No difficulty swallowing or breathing.  He denies fever, chills, shortness of breath, abdominal pain, or other symptoms.  Treatment attempted at home with Benadryl and OTC itch cream.  The history is provided by the patient and medical records.    Past Medical History:  Diagnosis Date  . Pneumonia     There are no problems to display for this patient.   Past Surgical History:  Procedure Laterality Date  . APPENDECTOMY         Home Medications    Prior to Admission medications   Medication Sig Start Date End Date Taking? Authorizing Provider  predniSONE (STERAPRED UNI-PAK 21 TAB) 10 MG (21) TBPK tablet Take by mouth daily. As directed 06/28/20  Yes Mickie Bail, NP  amoxicillin (AMOXIL) 875 MG tablet Take 1 tablet (875 mg total) by mouth 2 (two) times daily. 06/08/20   Wallis Bamberg, PA-C  benzonatate (TESSALON) 100 MG capsule Take 1-2 capsules (100-200 mg total) by mouth 3 (three) times daily as needed. 06/08/20   Wallis Bamberg, PA-C  cetirizine (ZYRTEC ALLERGY) 10 MG tablet Take 1 tablet (10 mg total) by mouth daily. 06/08/20   Wallis Bamberg, PA-C  ibuprofen (ADVIL,MOTRIN) 800 MG tablet Take 1 tablet (800 mg total) by mouth 3 (three) times daily. 05/05/18   Wieters, Hallie C, PA-C  promethazine-dextromethorphan (PROMETHAZINE-DM) 6.25-15 MG/5ML syrup Take 5 mLs by  mouth at bedtime as needed for cough. 06/08/20   Wallis Bamberg, PA-C  pseudoephedrine (SUDAFED) 30 MG tablet Take 1 tablet (30 mg total) by mouth every 8 (eight) hours as needed for congestion. 06/08/20   Wallis Bamberg, PA-C  fluticasone (FLONASE) 50 MCG/ACT nasal spray Place 1-2 sprays into both nostrils daily for 7 days. 05/05/18 06/08/20  Wieters, Junius Creamer, PA-C    Family History Family History  Problem Relation Age of Onset  . Hypertension Mother     Social History Social History   Tobacco Use  . Smoking status: Never Smoker  . Smokeless tobacco: Never Used  Substance Use Topics  . Alcohol use: No  . Drug use: No     Allergies   Amoxicillin   Review of Systems Review of Systems  Constitutional: Negative for chills and fever.  HENT: Negative for ear pain and sore throat.   Eyes: Negative for pain and visual disturbance.  Respiratory: Negative for cough and shortness of breath.   Cardiovascular: Negative for chest pain and palpitations.  Gastrointestinal: Negative for abdominal pain and vomiting.  Genitourinary: Negative for dysuria and hematuria.  Musculoskeletal: Negative for arthralgias and back pain.  Skin: Positive for rash. Negative for color change.  Neurological: Negative for syncope, weakness and numbness.  All other systems reviewed and are negative.    Physical Exam Triage Vital Signs ED Triage Vitals  Enc Vitals Group     BP      Pulse      Resp      Temp      Temp src      SpO2      Weight      Height      Head Circumference      Peak Flow      Pain Score      Pain Loc      Pain Edu?      Excl. in [place]?    No data found.  Updated Vital Signs BP 138/74   Pulse 78   Temp 98.5 F (36.9 C)   Resp 18   SpO2 99%   Visual Acuity Right Eye Distance:   Left Eye Distance:   Bilateral Distance:    Right Eye Near:   Left Eye Near:    Bilateral Near:     Physical Exam Vitals and nursing note reviewed.  Constitutional:      General: He is not  in acute distress.    Appearance: He is well-developed. He is not ill-appearing.  HENT:     Head: Normocephalic and atraumatic.     Mouth/Throat:     Mouth: Mucous membranes are moist.     Pharynx: Oropharynx is clear.     Comments: Speech clear.  No difficulty swallowing.   Eyes:     Conjunctiva/sclera: Conjunctivae normal.  Cardiovascular:     Rate and Rhythm: Normal rate and regular rhythm.     Heart sounds: Normal heart sounds.  Pulmonary:     Effort: Pulmonary effort is normal. No respiratory distress.     Breath sounds: Normal breath sounds.  Abdominal:     Palpations: Abdomen is soft.     Tenderness: There is no abdominal tenderness.  Musculoskeletal:        General: Normal range of motion.     Cervical back: Neck supple.  Skin:    General: Skin is warm and dry.     Findings: Rash present.     Comments: Faint erythema noted at the edges of some tattoos on forearms and lower legs.  No open wounds or drainage.  Neurological:     General: No focal deficit present.     Mental Status: He is alert and oriented to person, place, and time.     Gait: Gait normal.  Psychiatric:        Mood and Affect: Mood normal.        Behavior: Behavior normal.      UC Treatments / Results  Labs (all labs ordered are listed, but only abnormal results are displayed) Labs Reviewed - No data to display  EKG   Radiology No results found.  Procedures Procedures (including critical care time)  Medications Ordered in UC Medications - No data to display  Initial Impression / Assessment and Plan / UC Course  I have reviewed the triage vital signs and the nursing notes.  Pertinent labs & imaging results that were available during my care of the patient were reviewed by me and considered in my medical decision making (see chart for details).   Rash.  Amoxicillin added to allergy list in chart.  Treating with prednisone and Benadryl.  Precautions for drowsiness with Benadryl discussed.   Instructed patient to take Claritin instead if he needs to be awake and alert.  ED precautions discussed.  Instructed him to follow-up with his PCP if his symptoms are not  improving.  He agrees to plan of care.   Final Clinical Impressions(s) / UC Diagnoses   Final diagnoses:  Rash     Discharge Instructions     Take the prednisone as directed.    Take Benadryl every 6 hours as directed; do not drive, operate machinery, or drink alcohol with this medication as it may cause drowsiness.  If you need to be awake and alert, take Claritin as directed.    Go to the emergency department if you have difficulty swallowing or breathing.    Follow up with your primary care provider if your symptoms are not improving.             ED Prescriptions    Medication Sig Dispense Auth. Provider   predniSONE (STERAPRED UNI-PAK 21 TAB) 10 MG (21) TBPK tablet Take by mouth daily. As directed 21 tablet Mickie Bail, NP     PDMP not reviewed this encounter.   Mickie Bail, NP 06/28/20 1310

## 2020-06-28 NOTE — ED Triage Notes (Addendum)
Pt in with c/o rash specifically on his tattoos that he noticed a few weeks ago after he was taking amoxicillin for a sinus infection  Raised/red areas on legs and arms. Pt also states the areas are starting to itch  Pt has taken benadryl for sxs and applied ointment to help with itching (unsure of the name)

## 2020-06-28 NOTE — Discharge Instructions (Signed)
Take the prednisone as directed.    Take Benadryl every 6 hours as directed; do not drive, operate machinery, or drink alcohol with this medication as it may cause drowsiness.  If you need to be awake and alert, take Claritin as directed.    Go to the emergency department if you have difficulty swallowing or breathing.    Follow up with your primary care provider if your symptoms are not improving.

## 2020-07-20 ENCOUNTER — Ambulatory Visit (HOSPITAL_COMMUNITY)
Admission: EM | Admit: 2020-07-20 | Discharge: 2020-07-20 | Disposition: A | Discharge status: Home / Self Care | Payer: Medicaid Other | Attending: Urgent Care | Admitting: Urgent Care

## 2020-07-20 ENCOUNTER — Encounter (HOSPITAL_COMMUNITY): Payer: Self-pay | Admitting: Emergency Medicine

## 2020-07-20 ENCOUNTER — Other Ambulatory Visit: Payer: Self-pay

## 2020-07-20 DIAGNOSIS — L299 Pruritus, unspecified: Secondary | ICD-10-CM

## 2020-07-20 DIAGNOSIS — G43809 Other migraine, not intractable, without status migrainosus: Secondary | ICD-10-CM

## 2020-07-20 DIAGNOSIS — R21 Rash and other nonspecific skin eruption: Secondary | ICD-10-CM

## 2020-07-20 LAB — COMPREHENSIVE METABOLIC PANEL
ALT: 33 U/L (ref 0–44)
AST: 30 U/L (ref 15–41)
Albumin: 4.2 g/dL (ref 3.5–5.0)
Alkaline Phosphatase: 76 U/L (ref 38–126)
Anion gap: 7 (ref 5–15)
BUN: 8 mg/dL (ref 6–20)
CO2: 30 mmol/L (ref 22–32)
Calcium: 9.4 mg/dL (ref 8.9–10.3)
Chloride: 101 mmol/L (ref 98–111)
Creatinine, Ser: 1.1 mg/dL (ref 0.61–1.24)
GFR, Estimated: >60 mL/min (ref >60)
Glucose, Bld: 86 mg/dL (ref 70–99)
Potassium: 3.9 mmol/L (ref 3.5–5.1)
Sodium: 138 mmol/L (ref 135–145)
Total Bilirubin: 1 mg/dL (ref 0.3–1.2)
Total Protein: 7.8 g/dL (ref 6.5–8.1)

## 2020-07-20 LAB — CBC WITH DIFFERENTIAL/PLATELET
Abs Immature Granulocytes: 0.01 10*3/uL (ref 0.00–0.07)
Basophils Absolute: 0 10*3/uL (ref 0.0–0.1)
Basophils Relative: 1 %
Eosinophils Absolute: 0.2 10*3/uL (ref 0.0–0.5)
Eosinophils Relative: 3 %
HCT: 46.9 % (ref 39.0–52.0)
Hemoglobin: 15.3 g/dL (ref 13.0–17.0)
Immature Granulocytes: 0 %
Lymphocytes Relative: 16 %
Lymphs Abs: 1 10*3/uL (ref 0.7–4.0)
MCH: 25.4 pg — ABNORMAL LOW (ref 26.0–34.0)
MCHC: 32.6 g/dL (ref 30.0–36.0)
MCV: 77.9 fL — ABNORMAL LOW (ref 80.0–100.0)
Monocytes Absolute: 0.7 10*3/uL (ref 0.1–1.0)
Monocytes Relative: 11 %
Neutro Abs: 4.4 10*3/uL (ref 1.7–7.7)
Neutrophils Relative %: 69 %
Platelets: 235 10*3/uL (ref 150–400)
RBC: 6.02 MIL/uL — ABNORMAL HIGH (ref 4.22–5.81)
RDW: 12.9 % (ref 11.5–15.5)
WBC: 6.3 10*3/uL (ref 4.0–10.5)
nRBC: 0 % (ref 0.0–0.2)

## 2020-07-20 LAB — HIV ANTIBODY (ROUTINE TESTING W REFLEX): HIV Screen 4th Generation wRfx: NONREACTIVE

## 2020-07-20 MED ORDER — HYDROXYZINE HCL 25 MG PO TABS: 12.5000 mg | ORAL_TABLET | Freq: Three times a day (TID) | ORAL | 0 refills | Status: DC | PRN

## 2020-07-20 MED ORDER — KETOROLAC TROMETHAMINE 60 MG/2ML IM SOLN
60.0000 mg | Freq: Once | INTRAMUSCULAR | Status: AC
  Administered 2020-07-20: 60 mg via INTRAMUSCULAR

## 2020-07-20 MED ORDER — KETOROLAC TROMETHAMINE 60 MG/2ML IM SOLN
INTRAMUSCULAR | Status: AC
  Filled 2020-07-20: qty 2

## 2020-07-20 MED ORDER — PREDNISONE 50 MG PO TABS: 50.0000 mg | ORAL_TABLET | Freq: Every day | ORAL | 0 refills | Status: DC

## 2020-07-20 NOTE — ED Triage Notes (Addendum)
Pt presents with sinus pressure and headache. States was treated for sinus infection last month but feels symptoms are returning. Steates took tylenol this am with no relief.   States still having reaction to what he thinks is amoxicillin on both arms, specifically on areas with tattoo ink.

## 2020-07-20 NOTE — ED Notes (Signed)
Currently having blood drawn

## 2020-07-20 NOTE — ED Provider Notes (Signed)
[facility]   MRN: [id number] DOB: 1995/10/07  Subjective:   Jimmy Powers is a 25 y.o. male presenting for 4-5 day history of persistent alternating right-sided to left-sided temporal headache with pressure felt directly over his left eye currently.  Patient states his symptoms initially started on the right temporal side but are now on the left side.  Has also had photophobia, transient blurred vision of the left eye only.  Denies fever, eye drainage, eye trauma, cough, chest pain, shortness of breath, nausea, vomiting, abdominal pain.  Has also had a persistent rash overlying the tattoos of his arms, stomach, chest and lower limbs.  States that this particular rash And then was worse after he had started taking amoxicillin more than a month ago.  This was prescribed for a sinus infection.  He stopped it after about 4 to 5 days.  He was subsequently prescribed prednisone which helped some with the rash on his tattoo but did not completely resolve it.  Symptoms have not worsened to that point but have persisted and is now having itchiness.  Patient is sexually active, tries to use condoms consistently, has sex with only male partners.  Denies history of headaches, migraines.  No other chronic conditions.  Tattoos were obtained over a different timeline going back 2, 4 and 10 years.  No current facility-administered medications for this encounter.  Current Outpatient Medications:  .  amoxicillin (AMOXIL) 875 MG tablet, Take 1 tablet (875 mg total) by mouth 2 (two) times daily., Disp: 14 tablet, Rfl: 0 .  benzonatate (TESSALON) 100 MG capsule, Take 1-2 capsules (100-200 mg total) by mouth 3 (three) times daily as needed., Disp: 60 capsule, Rfl: 0 .  cetirizine (ZYRTEC ALLERGY) 10 MG tablet, Take 1 tablet (10 mg total) by mouth daily., Disp: 30 tablet, Rfl: 0 .  ibuprofen (ADVIL,MOTRIN) 800 MG tablet, Take 1 tablet (800 mg total) by mouth 3 (three) times daily., Disp: 21 tablet,  Rfl: 0 .  predniSONE (STERAPRED UNI-PAK 21 TAB) 10 MG (21) TBPK tablet, Take by mouth daily. As directed, Disp: 21 tablet, Rfl: 0 .  promethazine-dextromethorphan (PROMETHAZINE-DM) 6.25-15 MG/5ML syrup, Take 5 mLs by mouth at bedtime as needed for cough., Disp: 100 mL, Rfl: 0 .  pseudoephedrine (SUDAFED) 30 MG tablet, Take 1 tablet (30 mg total) by mouth every 8 (eight) hours as needed for congestion., Disp: 30 tablet, Rfl: 0   Allergies  Allergen Reactions  . Amoxicillin Rash    Past Medical History:  Diagnosis Date  . Pneumonia      Past Surgical History:  Procedure Laterality Date  . APPENDECTOMY      Family History  Problem Relation Age of Onset  . Hypertension Mother     Social History   Tobacco Use  . Smoking status: Never Smoker  . Smokeless tobacco: Never Used  Substance Use Topics  . Alcohol use: No  . Drug use: No    ROS   Objective:   Vitals: BP 129/63 (BP Location: Right Arm)   Pulse 90   Temp 98.9 F (37.2 C) (Oral)   Resp 17   SpO2 98%   Physical Exam Constitutional:      General: He is not in acute distress.    Appearance: Normal appearance. He is well-developed and normal weight. He is not ill-appearing, toxic-appearing or diaphoretic.  HENT:     Head: Normocephalic and atraumatic.     Right Ear: Tympanic membrane, ear canal and external ear normal.  There is no impacted cerumen.     Left Ear: Tympanic membrane, ear canal and external ear normal. There is no impacted cerumen.     Nose: Nose normal. No congestion or rhinorrhea.     Mouth/Throat:     Mouth: Mucous membranes are moist.     Pharynx: Oropharynx is clear. No oropharyngeal exudate or posterior oropharyngeal erythema.  Eyes:     General: No scleral icterus.       Right eye: No discharge.        Left eye: No discharge.     Extraocular Movements: Extraocular movements intact.     Pupils: Pupils are equal, round, and reactive to light.     Comments: Conjunctiva slightly injected  bilaterally. Tenderness with eye movements of left eye.    Cardiovascular:     Rate and Rhythm: Normal rate and regular rhythm.     Heart sounds: Normal heart sounds. No murmur heard. No friction rub. No gallop.   Pulmonary:     Effort: Pulmonary effort is normal. No respiratory distress.     Breath sounds: Normal breath sounds. No stridor. No wheezing, rhonchi or rales.  Musculoskeletal:     Cervical back: Normal range of motion and neck supple. No rigidity. No muscular tenderness.  Skin:    General: Skin is warm and dry.     Findings: Rash (diffuse urticarial type rash over extremities associated with his tattoo over his limbs, stomach and chest) present.  Neurological:     General: No focal deficit present.     Mental Status: He is alert and oriented to person, place, and time.     Cranial Nerves: No cranial nerve deficit.     Motor: No weakness.     Coordination: Romberg sign negative. Coordination normal.     Gait: Gait normal.     Deep Tendon Reflexes: Reflexes normal.  Psychiatric:        Mood and Affect: Mood normal. Mood is not anxious or depressed.        Behavior: Behavior normal. Behavior is not agitated.        Thought Content: Thought content normal.        Cognition and Memory: Cognition is not impaired. Memory is not impaired.        Judgment: Judgment normal.    Results for orders placed or performed during the hospital encounter of 07/20/20 (from the past 24 hour(s))  CBC with Differential     Status: Abnormal   Collection Time: 07/20/20 12:37 PM  Result Value Ref Range   WBC 6.3 4.0 - 10.5 K/uL   RBC 6.02 (H) 4.22 - 5.81 MIL/uL   Hemoglobin 15.3 13.0 - 17.0 g/dL   HCT 82.5 05.3 - 97.6 %   MCV 77.9 (L) 80.0 - 100.0 fL   MCH 25.4 (L) 26.0 - 34.0 pg   MCHC 32.6 30.0 - 36.0 g/dL   RDW 73.4 19.3 - 79.0 %   Platelets 235 150 - 400 K/uL   nRBC 0.0 0.0 - 0.2 %   Neutrophils Relative % 69 %   Neutro Abs 4.4 1.7 - 7.7 K/uL   Lymphocytes Relative 16 %   Lymphs  Abs 1.0 0.7 - 4.0 K/uL   Monocytes Relative 11 %   Monocytes Absolute 0.7 0.1 - 1.0 K/uL   Eosinophils Relative 3 %   Eosinophils Absolute 0.2 0.0 - 0.5 K/uL   Basophils Relative 1 %   Basophils Absolute 0.0 0.0 - 0.1 K/uL  Immature Granulocytes 0 %   Abs Immature Granulocytes 0.01 0.00 - 0.07 K/uL  Comprehensive metabolic panel     Status: None   Collection Time: 07/20/20 12:37 PM  Result Value Ref Range   Sodium 138 135 - 145 mmol/L   Potassium 3.9 3.5 - 5.1 mmol/L   Chloride 101 98 - 111 mmol/L   CO2 30 22 - 32 mmol/L   Glucose, Bld 86 70 - 99 mg/dL   BUN 8 6 - 20 mg/dL   Creatinine, Ser 5.03 0.61 - 1.24 mg/dL   Calcium 9.4 8.9 - 54.6 mg/dL   Total Protein 7.8 6.5 - 8.1 g/dL   Albumin 4.2 3.5 - 5.0 g/dL   AST 30 15 - 41 U/L   ALT 33 0 - 44 U/L   Alkaline Phosphatase 76 38 - 126 U/L   Total Bilirubin 1.0 0.3 - 1.2 mg/dL   GFR, Estimated >56 >81 mL/min   Anion gap 7 5 - 15     Assessment and Plan :   PDMP not reviewed this encounter.  1. Other migraine without status migrainosus, not intractable   2. Rash and nonspecific skin eruption   3. Itching     Will manage for migraine, IM Toradol in clinic. Recommended supportive care otherwise, general headache management, health maintenance.  Regarding his rash, will use an oral prednisone course, hydroxyzine.  Suspect undifferentiated reaction to the tattoo ink.  Follow-up with dermatology as soon as possible. Counseled patient on potential for adverse effects with medications prescribed/recommended today, ER and return-to-clinic precautions discussed, patient verbalized understanding.    Wallis Bamberg, PA-C 07/20/20 1328

## 2020-07-20 NOTE — Discharge Instructions (Signed)
Today we have managed her headache for migraine type headache.  Please make sure that you are hydrating well, eating regular healthy balanced meals.  If your eye continues to hurt then please call Dr. Dione Booze for follow-up and eye exam.  I am also recommending you follow-up with [place] [facility] for persistent rash of your tattoos.  In the meantime, use a prednisone course and hydroxyzine for itching.

## 2020-07-21 LAB — RPR: RPR Ser Ql: NONREACTIVE

## 2020-07-24 ENCOUNTER — Encounter: Payer: Self-pay | Admitting: *Deleted

## 2020-08-13 ENCOUNTER — Encounter (HOSPITAL_COMMUNITY): Payer: Self-pay

## 2020-08-13 ENCOUNTER — Ambulatory Visit (HOSPITAL_COMMUNITY)
Admission: EM | Admit: 2020-08-13 | Discharge: 2020-08-13 | Disposition: A | Discharge status: Home / Self Care | Payer: Medicaid Other | Attending: Emergency Medicine | Admitting: Emergency Medicine

## 2020-08-13 DIAGNOSIS — J0100 Acute maxillary sinusitis, unspecified: Secondary | ICD-10-CM

## 2020-08-13 DIAGNOSIS — H1033 Unspecified acute conjunctivitis, bilateral: Secondary | ICD-10-CM

## 2020-08-13 MED ORDER — MOXIFLOXACIN HCL 0.5 % OP SOLN: 1.0000 [drp] | Freq: Four times a day (QID) | OPHTHALMIC | 0 refills | Status: DC

## 2020-08-13 MED ORDER — OLOPATADINE HCL 0.2 % OP SOLN: 1.0000 [drp] | Freq: Every day | OPHTHALMIC | 0 refills | Status: DC

## 2020-08-13 MED ORDER — FLUORESCEIN SODIUM 1 MG OP STRP
ORAL_STRIP | OPHTHALMIC | Status: AC
  Filled 2020-08-13: qty 1

## 2020-08-13 MED ORDER — TETRACAINE HCL 0.5 % OP SOLN
OPHTHALMIC | Status: AC
  Filled 2020-08-13: qty 4

## 2020-08-13 MED ORDER — FLUTICASONE PROPIONATE 50 MCG/ACT NA SUSP: 2.0000 | Freq: Every day | NASAL | 0 refills | Status: DC

## 2020-08-13 MED ORDER — DOXYCYCLINE HYCLATE 100 MG PO CAPS: 100.0000 mg | ORAL_CAPSULE | Freq: Two times a day (BID) | ORAL | 0 refills | Status: AC

## 2020-08-13 NOTE — ED Triage Notes (Signed)
Pt presents with sinus congestion & pain and bilateral eye pain & irritation X 4 days.

## 2020-08-13 NOTE — Discharge Instructions (Signed)
Flonase, saline nasal irrigation with a Lloyd Huger Med rinse and distilled water as often as you want to wash out infection.  Finish the doxycycline for the sinus infection, even if you feel better.  You may try Claritin-D, Allegra-D, or Zyrtec-D.  Make sure you use the moxifloxacin eyedrop 4 times a day.  Try the olopatadine eyedrops.  Please follow-up with Dr. Charlotte Sanes tomorrow.  I have spoken with her today.  Be at her office by 815 tomorrow at the latest.  Go to the ER if you get worse.  Below is a list of primary care practices who are taking new patients for you to follow-up with.  [facility] internal medicine clinic Ground Floor - [facility], [address] [place], [place], [place] [address] [phone]  [facility] at [facility] [address] [place], [place] [address] [phone]  [facility] [address] [place], [place] [address] [phone]  [facility] [phone] [address] [place] [address]  [facility]: [address] [place] [address]  [phone]  [facility]: [address] [place] [address]   [phone]  [facility]: [address] [place] [address]  [phone]  [facility] : [address] [place] [address] [phone]  [facility]: [address] [place] [address] [phone]  [facility]: [address] [place] [address] [phone]  Dr. Oneal Grout [address] [facility] [place] [address]  [phone]  Go to www.goodrx.com  or www.costplusdrugs.com to look up your medications. This will give you a list of where you can find your prescriptions at the most affordable  prices. Or ask the pharmacist what the cash price is, or if they have any other discount programs available to help make your medication more affordable. This can be less expensive than what you would pay with insurance.

## 2020-08-13 NOTE — ED Provider Notes (Signed)
HPI  SUBJECTIVE:  Jimmy Powers is a 25 y.o. male who presents with 6 days of right eye conjunctival injection, increased tearing.  He reports blurry vision, photophobia starting 5 days ago.  He reports right-sided sinus pain and pressure, sneezing, yellowish rhinorrhea and nasal congestion, upper dental pain for the past 3 days.  He denies rubbing his eye, no itchy eyes.  No fevers, facial swelling.  No trauma to the eye, headache, halos around lights, foreign body sensation, gritty feeling, pain with extraocular movements, double vision, visual loss.  He does not wear glasses or contacts.  States that his vision is normally good, but he thinks that he might need glasses.  He states that he saw an ophthalmologist for this earlier in the week, and was advised to try Visine.  No prescriptions were provided.  He tried Tylenol which helps with the eye pain, Benadryl, Zyrtec which helps with the sinus pain and pressure/nasal congestion and sinus over-the-counter medications.  His eye pain is worse with exposure to light and his sinuses are worse with exposure to dust and pollen.  He took Tylenol within 6 hours of evaluation.  No antibiotics in the past month although he reports having hives with amoxicillin that he took for a sinus infection a few months ago.  He has a past medical history of seasonal allergies starting this year.  No history of diabetes, hypertension, glaucoma, sickle cell disease, frequent sinusitis.  PMD: None.   Past Medical History:  Diagnosis Date  . Pneumonia     Past Surgical History:  Procedure Laterality Date  . APPENDECTOMY      Family History  Problem Relation Age of Onset  . Hypertension Mother     Social History   Tobacco Use  . Smoking status: Never Smoker  . Smokeless tobacco: Never Used  Substance Use Topics  . Alcohol use: No  . Drug use: No    No current facility-administered medications for this encounter.  Current Outpatient Medications:  .   doxycycline (VIBRAMYCIN) 100 MG capsule, Take 1 capsule (100 mg total) by mouth 2 (two) times daily for 10 days., Disp: 20 capsule, Rfl: 0 .  fluticasone (FLONASE) 50 MCG/ACT nasal spray, Place 2 sprays into both nostrils daily., Disp: 16 g, Rfl: 0 .  moxifloxacin (VIGAMOX) 0.5 % ophthalmic solution, Place 1 drop into the right eye in the morning, at noon, in the evening, and at bedtime., Disp: 3 mL, Rfl: 0 .  Olopatadine HCl 0.2 % SOLN, Apply 1 drop to eye daily., Disp: 2.5 mL, Rfl: 0  Allergies  Allergen Reactions  . Amoxicillin Rash     ROS  As noted in HPI.   Physical Exam  BP 120/80 (BP Location: Left Arm)   Pulse 87   Temp 98.5 F (36.9 C) (Oral)   Resp 18   SpO2 95%   Constitutional: Well developed, well nourished, no acute distress Eyes:  EOMI, bilateral conjunctival injection.  Positive direct photophobia right side.  No consensual photophobia.  No hyphema.  No foreign body seen on lid eversion.  No abrasion seen on flourescin exam, foreign body seen on magnification.  No pain with EOMs.  Cornea clear, PERRLA.  No periorbital erythema, edema.   Visual Acuity  Right Eye Distance: 20/200 Left Eye Distance: 20/70 Bilateral Distance: 20/50  Right Eye Near:   Left Eye Near:    Bilateral Near:      IOP right: 21         left: 13  HENT: Normocephalic, atraumatic,mucus membranes moist.  Positive purulent nasal congestion right side.  Positive right-sided maxillary sinus tenderness. Respiratory: Normal inspiratory effort Cardiovascular: Normal rate GI: nondistended skin: No rash, skin intact Musculoskeletal: no deformities Neurologic: Alert & oriented x 3, no focal neuro deficits Psychiatric: Speech and behavior appropriate   ED Course   Medications - No data to display  Orders Placed This Encounter  Procedures  . Visual acuity screening    Standing Status:   Standing    Number of Occurrences:   1    No results found for this or any previous visit (from the  past 24 hour(s)). No results found.  ED Clinical Impression  1. Acute conjunctivitis of both eyes, unspecified acute conjunctivitis type   2. Acute non-recurrent maxillary sinusitis      ED Assessment/Plan  Patient with right conjunctival injection and photophobia.  No obvious foreign body, abrasion, hyphema.  His intraocular pressure is within normal limits although it is increased compared to the other eye. no evidence of pre or post septal cellulitis.  His vision is markedly different than the vision in his other eye.  He also has a right-sided sinus infection, which could be worsening his symptoms.  Unsure to the etiology of his symptoms.  Discussed with Dr. Charlotte Sanes, ophthalmology on-call.  She recommends moxifloxacin eyedrops 4 times daily, oral antibiotics for the sinusitis, and will have him follow-up with her tomorrow at 0815 in the morning.  The sinusitis seems to be due to allergies.  Will send home with antihistamine/decongestant combination, Pataday, doxycycline, saline nasal irrigation and Flonase in addition to the moxifloxacin ophthalmic.  Will provide primary care list for ongoing care and order assistance in finding a PMD.  Discussed  MDM, treatment plan, and plan for follow-up with patient. Discussed sn/sx that should prompt return to the ED. patient agrees with plan.   Meds ordered this encounter  Medications  . Olopatadine HCl 0.2 % SOLN    Sig: Apply 1 drop to eye daily.    Dispense:  2.5 mL    Refill:  0  . fluticasone (FLONASE) 50 MCG/ACT nasal spray    Sig: Place 2 sprays into both nostrils daily.    Dispense:  16 g    Refill:  0  . doxycycline (VIBRAMYCIN) 100 MG capsule    Sig: Take 1 capsule (100 mg total) by mouth 2 (two) times daily for 10 days.    Dispense:  20 capsule    Refill:  0  . moxifloxacin (VIGAMOX) 0.5 % ophthalmic solution    Sig: Place 1 drop into the right eye in the morning, at noon, in the evening, and at bedtime.    Dispense:  3 mL     Refill:  0      *This clinic note was created using Scientist, clinical (histocompatibility and immunogenetics). Therefore, there may be occasional mistakes despite careful proofreading.  ?    Domenick Gong, MD 08/14/20 [phone]

## 2020-11-06 ENCOUNTER — Telehealth: Payer: Medicaid Other | Admitting: Critical Care Medicine

## 2021-01-23 ENCOUNTER — Encounter (HOSPITAL_COMMUNITY): Payer: Self-pay | Admitting: Emergency Medicine

## 2021-01-23 ENCOUNTER — Other Ambulatory Visit: Payer: Self-pay

## 2021-01-23 ENCOUNTER — Ambulatory Visit (HOSPITAL_COMMUNITY)
Admission: EM | Admit: 2021-01-23 | Discharge: 2021-01-23 | Disposition: A | Discharge status: Home / Self Care | Payer: Medicaid Other | Attending: Emergency Medicine | Admitting: Emergency Medicine

## 2021-01-23 DIAGNOSIS — R202 Paresthesia of skin: Secondary | ICD-10-CM | POA: Insufficient documentation

## 2021-01-23 DIAGNOSIS — M7989 Other specified soft tissue disorders: Secondary | ICD-10-CM | POA: Insufficient documentation

## 2021-01-23 DIAGNOSIS — R200 Anesthesia of skin: Secondary | ICD-10-CM | POA: Insufficient documentation

## 2021-01-23 LAB — CBC
HCT: 48.5 % (ref 39.0–52.0)
Hemoglobin: 15.6 g/dL (ref 13.0–17.0)
MCH: 24.3 pg — ABNORMAL LOW (ref 26.0–34.0)
MCHC: 32.2 g/dL (ref 30.0–36.0)
MCV: 75.7 fL — ABNORMAL LOW (ref 80.0–100.0)
Platelets: 245 10*3/uL (ref 150–400)
RBC: 6.41 MIL/uL — ABNORMAL HIGH (ref 4.22–5.81)
RDW: 14 % (ref 11.5–15.5)
WBC: 6 10*3/uL (ref 4.0–10.5)
nRBC: 0 % (ref 0.0–0.2)

## 2021-01-23 LAB — COMPREHENSIVE METABOLIC PANEL
ALT: 33 U/L (ref 0–44)
AST: 33 U/L (ref 15–41)
Albumin: 3.8 g/dL (ref 3.5–5.0)
Alkaline Phosphatase: 99 U/L (ref 38–126)
Anion gap: 9 (ref 5–15)
BUN: 11 mg/dL (ref 6–20)
CO2: 27 mmol/L (ref 22–32)
Calcium: 9.3 mg/dL (ref 8.9–10.3)
Chloride: 102 mmol/L (ref 98–111)
Creatinine, Ser: 1.12 mg/dL (ref 0.61–1.24)
GFR, Estimated: >60 mL/min (ref >60)
Glucose, Bld: 96 mg/dL (ref 70–99)
Potassium: 4.2 mmol/L (ref 3.5–5.1)
Sodium: 138 mmol/L (ref 135–145)
Total Bilirubin: 1 mg/dL (ref 0.3–1.2)
Total Protein: 8 g/dL (ref 6.5–8.1)

## 2021-01-23 LAB — POCT URINALYSIS DIPSTICK, ED / UC
Bilirubin Urine: NEGATIVE
Glucose, UA: NEGATIVE mg/dL
Hgb urine dipstick: NEGATIVE
Ketones, ur: NEGATIVE mg/dL
Leukocytes,Ua: NEGATIVE
Nitrite: NEGATIVE
Protein, ur: NEGATIVE mg/dL
Specific Gravity, Urine: 1.02 (ref 1.005–1.030)
Urobilinogen, UA: 0.2 mg/dL (ref 0.0–1.0)
pH: 5.5 (ref 5.0–8.0)

## 2021-01-23 LAB — CBG MONITORING, ED: Glucose-Capillary: 102 mg/dL — ABNORMAL HIGH (ref 70–99)

## 2021-01-23 NOTE — Discharge Instructions (Addendum)
Discussed that your labs will return today check my chart for results we will call with any abnormal's Your cbg was normal  If symptoms cont call neuro

## 2021-01-23 NOTE — ED Triage Notes (Signed)
Pt reports for about week to 2 weeks had bilat hand numbness and reports feet are doing the same thing.

## 2021-01-23 NOTE — ED Provider Notes (Signed)
[facility]    CSN: [id number] Arrival date & time: 01/23/21  5102      History   Chief Complaint Chief Complaint  Patient presents with   Numbness    Hands and feet    HPI Geary Guin is a 25 y.o. male.   Pt here for tingling sensation and intermit swelling to hands and feet for 2 weeks now. States that he has noticed more voiding over the past year. His family has a high risk for DM and was worried. Pt does work on feet 10 hours days. Denies any n/v/d, no recent illness. No known bp issues. No back problems.    Past Medical History:  Diagnosis Date   Pneumonia     There are no problems to display for this patient.   Past Surgical History:  Procedure Laterality Date   APPENDECTOMY         Home Medications    Prior to Admission medications   Medication Sig Start Date End Date Taking? Authorizing Provider  fluticasone (FLONASE) 50 MCG/ACT nasal spray Place 2 sprays into both nostrils daily. 08/13/20   Domenick Gong, MD  moxifloxacin (VIGAMOX) 0.5 % ophthalmic solution Place 1 drop into the right eye in the morning, at noon, in the evening, and at bedtime. 08/13/20   Domenick Gong, MD  Olopatadine HCl 0.2 % SOLN Apply 1 drop to eye daily. 08/13/20   Domenick Gong, MD  cetirizine (ZYRTEC ALLERGY) 10 MG tablet Take 1 tablet (10 mg total) by mouth daily. 06/08/20 07/20/20  Wallis Bamberg, PA-C    Family History Family History  Problem Relation Age of Onset   Hypertension Mother     Social History Social History   Tobacco Use   Smoking status: Never   Smokeless tobacco: Never  Substance Use Topics   Alcohol use: No   Drug use: No     Allergies   Amoxicillin   Review of Systems Review of Systems  Constitutional:  Negative for appetite change and fever.  Eyes: Negative.   Respiratory: Negative.    Cardiovascular: Negative.   Gastrointestinal: Negative.   Genitourinary: Negative.   Musculoskeletal:  Positive for joint swelling.        Swelling to bil feet   Skin: Negative.   Neurological:  Positive for numbness. Negative for dizziness, tremors, syncope, facial asymmetry, light-headedness and headaches.       Numbness and tingling sensation to fingers and feet. Bil feet swelling     Physical Exam Triage Vital Signs ED Triage Vitals  Enc Vitals Group     BP 01/23/21 1000 127/77     Pulse Rate 01/23/21 1000 92     Resp 01/23/21 1000 16     Temp 01/23/21 1000 99.1 F (37.3 C)     Temp Source 01/23/21 1000 Oral     SpO2 01/23/21 1000 97 %     Weight --      Height --      Head Circumference --      Peak Flow --      Pain Score 01/23/21 0959 8     Pain Loc --      Pain Edu? --      Excl. in GC? --    No data found.  Updated Vital Signs BP 127/77 (BP Location: Left Arm)   Pulse 92   Temp 99.1 F (37.3 C) (Oral)   Resp 16   SpO2 97%   Visual Acuity Right Eye Distance:  Left Eye Distance:   Bilateral Distance:    Right Eye Near:   Left Eye Near:    Bilateral Near:     Physical Exam Constitutional:      Appearance: Normal appearance.  Cardiovascular:     Rate and Rhythm: Normal rate.  Pulmonary:     Effort: Pulmonary effort is normal.  Abdominal:     General: Abdomen is flat.  Musculoskeletal:        General: Swelling present. Normal range of motion.     Comments: Bil lower extremities +1 edema, strong pedal pulses. Full ROM.    Skin:    General: Skin is warm.     Coloration: Skin is not pale.     Findings: No erythema.  Neurological:     General: No focal deficit present.     Mental Status: He is alert.     UC Treatments / Results  Labs (all labs ordered are listed, but only abnormal results are displayed) Labs Reviewed  CBG MONITORING, ED - Abnormal; Notable for the following components:      Result Value   Glucose-Capillary 102 (*)    All other components within normal limits  CBC  COMPREHENSIVE METABOLIC PANEL  POCT URINALYSIS DIPSTICK, ED / UC     EKG   Radiology No results found.  Procedures Procedures (including critical care time)  Medications Ordered in UC Medications - No data to display  Initial Impression / Assessment and Plan / UC Course  I have reviewed the triage vital signs and the nursing notes.  Pertinent labs & imaging results that were available during my care of the patient were reviewed by me and considered in my medical decision making (see chart for details).     We drawn labs these will return today if anything is concerning we will call you  You will need to follow up with a pcp  If the tingling sensation cont see neurology  Stay hydrated well    Final Clinical Impressions(s) / UC Diagnoses   Final diagnoses:  Numbness  Bilateral swelling of feet  Tingling in extremities     Discharge Instructions      Discussed that your labs will return today check my chart for results we will call with any abnormal's Your cbg was normal  If symptoms cont call neuro       ED Prescriptions   None    PDMP not reviewed this encounter.   Coralyn Mark, NP 01/23/21 1108

## 2021-02-01 ENCOUNTER — Ambulatory Visit: Payer: Self-pay | Admitting: Nurse Practitioner

## 2021-02-02 ENCOUNTER — Emergency Department (HOSPITAL_COMMUNITY)
Admission: EM | Admit: 2021-02-02 | Discharge: 2021-02-02 | Disposition: A | Discharge status: Home / Self Care | Payer: Medicaid Other | Attending: Emergency Medicine | Admitting: Emergency Medicine

## 2021-02-02 ENCOUNTER — Encounter (HOSPITAL_COMMUNITY): Payer: Self-pay | Admitting: Emergency Medicine

## 2021-02-02 DIAGNOSIS — H1032 Unspecified acute conjunctivitis, left eye: Secondary | ICD-10-CM | POA: Insufficient documentation

## 2021-02-02 DIAGNOSIS — G510 Bell's palsy: Secondary | ICD-10-CM | POA: Insufficient documentation

## 2021-02-02 MED ORDER — VALACYCLOVIR HCL 1 G PO TABS: 1000.0000 mg | ORAL_TABLET | Freq: Three times a day (TID) | ORAL | 0 refills | Status: AC

## 2021-02-02 MED ORDER — TETRACAINE HCL 0.5 % OP SOLN
2.0000 [drp] | Freq: Once | OPHTHALMIC | Status: AC
  Administered 2021-02-02: 2 [drp] via OPHTHALMIC
  Filled 2021-02-02: qty 4

## 2021-02-02 MED ORDER — MOXIFLOXACIN HCL 0.5 % OP SOLN: 1.0000 [drp] | Freq: Four times a day (QID) | OPHTHALMIC | 0 refills | Status: DC

## 2021-02-02 MED ORDER — PREDNISONE 20 MG PO TABS: 60.0000 mg | ORAL_TABLET | Freq: Every day | ORAL | 0 refills | Status: AC

## 2021-02-02 MED ORDER — FLUORESCEIN SODIUM 1 MG OP STRP
1.0000 | ORAL_STRIP | Freq: Once | OPHTHALMIC | Status: AC
  Administered 2021-02-02: 1 via OPHTHALMIC
  Filled 2021-02-02: qty 1

## 2021-02-02 NOTE — ED Triage Notes (Signed)
Reports ongoing issues with his vision, last night he woke up and he cannot feel his ride side of his face. States he has not been sick recently. L eye is red and swollen. Multiple complaints. States he cannot feel his feet or legs. Cannot raise R eyebrow.

## 2021-02-02 NOTE — ED Provider Notes (Signed)
[facility] DEPT Provider Note   CSN: [id number] Arrival date & time: 02/02/21  [id number]     History Chief Complaint  Patient presents with  . Eye Pain  . Facial Droop    Jimmy Powers is a 25 y.o. male.  The history is provided by the patient.  Illness Location:  Right face, left eye Severity:  Mild Onset quality:  Gradual Duration:  5 hours Timing:  Constant Progression:  Unchanged Chronicity:  New Context:  Patient with right-sided facial weakness, left eye redness and pain Relieved by:  Nothing Worsened by:  Nothing Associated symptoms: no abdominal pain, no chest pain, no cough, no ear pain, no fever, no headaches, no rash, no shortness of breath, no sore throat and no vomiting       Past Medical History:  Diagnosis Date  . Pneumonia     There are no problems to display for this patient.   Past Surgical History:  Procedure Laterality Date  . APPENDECTOMY         Family History  Problem Relation Age of Onset  . Hypertension Mother     Social History   Tobacco Use  . Smoking status: Never  . Smokeless tobacco: Never  Substance Use Topics  . Alcohol use: No  . Drug use: No    Home Medications Prior to Admission medications   Medication Sig Start Date End Date Taking? Authorizing Provider  predniSONE (DELTASONE) 20 MG tablet Take 3 tablets (60 mg total) by mouth daily for 7 days. 02/02/21 02/09/21 Yes Cassara Nida, DO  valACYclovir (VALTREX) 1000 MG tablet Take 1 tablet (1,000 mg total) by mouth 3 (three) times daily for 7 days. 02/02/21 02/09/21 Yes Tikisha Molinaro, DO  fluticasone (FLONASE) 50 MCG/ACT nasal spray Place 2 sprays into both nostrils daily. 08/13/20   Melynda Ripple, MD  moxifloxacin (VIGAMOX) 0.5 % ophthalmic solution Place 1 drop into the left eye in the morning, at noon, in the evening, and at bedtime. 02/02/21   Jamille Fisher, DO  Olopatadine HCl 0.2 % SOLN Apply 1 drop to eye daily. 08/13/20    Melynda Ripple, MD  cetirizine (ZYRTEC ALLERGY) 10 MG tablet Take 1 tablet (10 mg total) by mouth daily. 06/08/20 07/20/20  Jaynee Eagles, PA-C    Allergies    Amoxicillin  Review of Systems   Review of Systems  Constitutional:  Negative for chills and fever.  HENT:  Negative for ear pain, sore throat and trouble swallowing.   Eyes:  Positive for photophobia, pain, discharge, redness and itching. Negative for visual disturbance.  Respiratory:  Negative for cough and shortness of breath.   Cardiovascular:  Negative for chest pain and palpitations.  Gastrointestinal:  Negative for abdominal pain and vomiting.  Genitourinary:  Negative for dysuria and hematuria.  Musculoskeletal:  Negative for arthralgias and back pain.  Skin:  Negative for color change and rash.  Neurological:  Positive for facial asymmetry. Negative for dizziness, tremors, seizures, syncope, speech difficulty, weakness, light-headedness, numbness and headaches.  All other systems reviewed and are negative.  Physical Exam Updated Vital Signs BP 137/77 (BP Location: Left Arm)   Pulse 77   Temp 97.9 F (36.6 C) (Oral)   Resp 16   SpO2 99%   Physical Exam Vitals and nursing note reviewed.  Constitutional:      Appearance: Normal appearance. He is well-developed. He is not ill-appearing.  HENT:     Head: Normocephalic and atraumatic.     Mouth/Throat:  Mouth: Mucous membranes are moist.  Eyes:     General:        Right eye: No discharge.        Left eye: No discharge.     Extraocular Movements: Extraocular movements intact.     Pupils: Pupils are equal, round, and reactive to light.     Comments: Right eye with mildly incomplete closure but normal extraocular movement bilaterally and no inflamed conjunctiva on the right, left eye with inflamed conjunctiva and normal extraocular movement, there is no periorbital swelling, pressure in each eye is within normal limits, visual acuity is 20/20 in both eyes,  fluorescein uptake is negative in both eyes  Cardiovascular:     Rate and Rhythm: Normal rate and regular rhythm.     Heart sounds: No murmur heard. Pulmonary:     Effort: Pulmonary effort is normal. No respiratory distress.     Breath sounds: Normal breath sounds.  Abdominal:     Palpations: Abdomen is soft.     Tenderness: There is no abdominal tenderness.  Musculoskeletal:     Cervical back: Neck supple.  Skin:    General: Skin is warm and dry.  Neurological:     Mental Status: He is alert and oriented to person, place, and time.     Comments: Has right-sided facial weakness that involves the forehead, otherwise 5+ out of 5 strength throughout, normal sensation, no drift, normal gait, normal finger-to-nose finger, normal visual acuity, no visual field deficit  Psychiatric:        Mood and Affect: Mood normal.    ED Results / Procedures / Treatments   Labs (all labs ordered are listed, but only abnormal results are displayed) Labs Reviewed - No data to display  EKG None  Radiology No results found.  Procedures Procedures   Medications Ordered in ED Medications  tetracaine (PONTOCAINE) 0.5 % ophthalmic solution 2 drop (2 drops Both Eyes Given by Other 02/02/21 1116)  fluorescein ophthalmic strip 1 strip (1 strip Both Eyes Given by Other 02/02/21 1117)    ED Course  I have reviewed the triage vital signs and the nursing notes.  Pertinent labs & imaging results that were available during my care of the patient were reviewed by me and considered in my medical decision making (see chart for details).    MDM Rules/Calculators/A&P                           Jimmy Powers is here with right-sided facial weakness and left thigh pain.  Patient with unremarkable vitals.  No fever.  States that he woke up overnight and noticed some weakness of the right side of his face.  Family member this morning told him that his right side of the face was not moving well.  He appears to have  a right-sided Bell's palsy on exam.  He has fairly good closure of his right eye.  He has normal strength and sensation throughout otherwise.  His exam is consistent with a Bell's palsy.  Incidentally he is also having some left eye redness and irritation.  This started this morning as well.  Not sure if he was scratching at his eyes and injured his left eye somehow.  He states that maybe he has had iritis in his eyes before in the past.  Fluorescein uptake is negative.  Pressure in each eye is normal.  Extraocular movements are within normal limits.  Suspect may be an  iritis or conjunctivitis.  We will start him on moxifloxacin drops which she was on in the past for this.  We will have him follow-up with ophthalmology.  We will have him follow-up with neurology.  Given education about these processes.  He is not having any headaches or other concerning neurological symptoms and will hold off on any head imaging at this time.  Will prescribe prednisone and valacyclovir for Bell's palsy.  Likely will need an autoimmune work-up if this is iritis.  He has had some mild viral symptoms recently and this could be viral in etiology.  Discharged in good condition.  Understands return precautions.  This chart was dictated using voice recognition software.  Despite best efforts to proofread,  errors can occur which can change the documentation meaning.  Final Clinical Impression(s) / ED Diagnoses Final diagnoses:  Bell's palsy  Acute conjunctivitis of left eye, unspecified acute conjunctivitis type    Rx / DC Orders ED Discharge Orders          Ordered    moxifloxacin (VIGAMOX) 0.5 % ophthalmic solution  4 times daily        02/02/21 1037    valACYclovir (VALTREX) 1000 MG tablet  3 times daily        02/02/21 1117    predniSONE (DELTASONE) 20 MG tablet  Daily        02/02/21 1117             Lennice Sites, DO 02/02/21 1124

## 2021-02-02 NOTE — Discharge Instructions (Addendum)
Take eyedrops as prescribed to your left eye.  Follow-up with ophthalmologist, ophthalmologist on-call today is the one that you actually saw several months ago.  Take prednisone and valacyclovir as prescribed.  Recommend artificial tears as needed as well which you can buy over-the-counter.

## 2021-02-27 ENCOUNTER — Ambulatory Visit: Payer: Self-pay | Admitting: Neurology

## 2021-02-28 ENCOUNTER — Encounter: Payer: Self-pay | Admitting: Neurology

## 2021-03-07 ENCOUNTER — Other Ambulatory Visit: Payer: Self-pay

## 2021-03-07 ENCOUNTER — Encounter (HOSPITAL_COMMUNITY): Payer: Self-pay

## 2021-03-07 ENCOUNTER — Ambulatory Visit (HOSPITAL_COMMUNITY)
Admission: EM | Admit: 2021-03-07 | Discharge: 2021-03-07 | Disposition: A | Discharge status: Home / Self Care | Payer: Medicaid Other | Attending: Urgent Care | Admitting: Urgent Care

## 2021-03-07 DIAGNOSIS — H15001 Unspecified scleritis, right eye: Secondary | ICD-10-CM

## 2021-03-07 MED ORDER — PAZEO 0.7 % OP SOLN: 1.0000 [drp] | Freq: Every day | OPHTHALMIC | 0 refills | Status: AC

## 2021-03-07 NOTE — Discharge Instructions (Addendum)
MUST call ophthalmologist tomorrow for STAT office visit.  Need to have intraocular pressure tested to rule out acute angle closure glaucoma. Due to recurrence, an autoimmune issue is of concern and thus labs should be obtained by eye specialist or PCP. You may continue your vigamox, you may use in both eyes. Do not start the drops called in today until after seeing specialist in the morning. For ANY worsening symptoms, head straight to the ER.

## 2021-03-07 NOTE — ED Triage Notes (Signed)
Pt presents with c/o eye pain. Pt states he has had the pain before, states it went away and has come back.

## 2021-03-07 NOTE — ED Provider Notes (Signed)
[facility]    CSN: [id number] Arrival date & time: 03/07/21  1529      History   Chief Complaint Chief Complaint  Patient presents with   Eye Problem    HPI Jimmy Powers is a 25 y.o. male who presents today with chief complaint of right eye pain.  He actually has been seen for this exact same condition intermittently for the past 6 months.  He was initially seen for the first time back in May in the ER in which he had right eye symptoms similar to that of today.  At that time he had tonometry performed which showed a pressure of 21 in his right and 13 in his left. He complained back in May and again today of eye pain, photophobia, erythema of the sclera.  He denies discharge or drainage.  He denies swelling of the orbit or the lids.  He was seen 3 weeks ago in the emergency room and given moxifloxacin ophthalmic solution.  He has been putting this on his left eye with symptom improvement, but has not done it on his affected eye.  He was concerned he might have a reaction.  He also was diagnosed with right-sided Bell's palsy 3 weeks ago in the emergency room, and states it is still bothering him today, with incomplete closure of his right eye.  He has been using over-the-counter artificial tears intermittently.  Patient states he has seen his ophthalmologist numerous times for this and has been given multiple different treatment options, all of which have worked momentarily, but the condition returns.  He states he had to call out of work today due to the pain in his eye, which responded very well to Tylenol.  Patient admits to seasonal allergy symptoms, and admits that his eyes feel itchy.  He denies any additional systemic symptoms and denies any known history of autoimmune conditions.  He denies rash.   Eye Problem Associated symptoms: itching, photophobia and redness (B, R>L)   Associated symptoms: no discharge    Past Medical History:  Diagnosis Date   Pneumonia     There  are no problems to display for this patient.   Past Surgical History:  Procedure Laterality Date   APPENDECTOMY         Home Medications    Prior to Admission medications   Medication Sig Start Date End Date Taking? Authorizing Provider  Olopatadine HCl (PAZEO) 0.7 % SOLN Apply 1 drop to eye daily for 7 days. 03/07/21 03/14/21 Yes Faizah Kandler L, PA  fluticasone (FLONASE) 50 MCG/ACT nasal spray Place 2 sprays into both nostrils daily. 08/13/20   Melynda Ripple, MD  moxifloxacin (VIGAMOX) 0.5 % ophthalmic solution Place 1 drop into the left eye in the morning, at noon, in the evening, and at bedtime. 02/02/21   Curatolo, Adam, DO  cetirizine (ZYRTEC ALLERGY) 10 MG tablet Take 1 tablet (10 mg total) by mouth daily. 06/08/20 07/20/20  Jaynee Eagles, PA-C    Family History Family History  Problem Relation Age of Onset   Hypertension Mother     Social History Social History   Tobacco Use   Smoking status: Never   Smokeless tobacco: Never  Substance Use Topics   Alcohol use: No   Drug use: No     Allergies   Amoxicillin   Review of Systems Review of Systems  Constitutional: Negative.   HENT:  Positive for postnasal drip, rhinorrhea and sneezing.   Eyes:  Positive for photophobia, pain (R  only), redness (B, R>L) and itching. Negative for discharge and visual disturbance.  Respiratory: Negative.    Cardiovascular: Negative.   Gastrointestinal: Negative.   Endocrine: Negative for cold intolerance.  Musculoskeletal:  Negative for arthralgias and myalgias.  Skin:  Negative for color change and rash.  Allergic/Immunologic: Positive for environmental allergies.  All other systems reviewed and are negative.   Physical Exam Triage Vital Signs ED Triage Vitals  Enc Vitals Group     BP 03/07/21 1807 (!) 144/108     Pulse Rate 03/07/21 1806 83     Resp 03/07/21 1806 17     Temp 03/07/21 1806 98.1 F (36.7 C)     Temp Source 03/07/21 1806 Oral     SpO2 03/07/21 1806 97  %     Weight --      Height --      Head Circumference --      Peak Flow --      Pain Score 03/07/21 1805 2     Pain Loc --      Pain Edu? --      Excl. in GC? --    No data found.  Updated Vital Signs BP (!) 144/108   Pulse 83   Temp 98.1 F (36.7 C) (Oral)   Resp 17   SpO2 97%    Visual Acuity  Right Eye Distance: 20/100 (Without correction) Left Eye Distance: 20/200 (Without correction) Bilateral Distance: 20/70 (Without correction)  Right Eye Near:   Left Eye Near:    Bilateral Near:         Physical Exam HENT:     Head: Normocephalic and atraumatic.     Salivary Glands: Right salivary gland is not diffusely enlarged or tender. Left salivary gland is not diffusely enlarged or tender.     Right Ear: Tympanic membrane, ear canal and external ear normal.     Left Ear: Tympanic membrane, ear canal and external ear normal.     Nose: Nose normal. No nasal tenderness or congestion.     Right Turbinates: Not enlarged or swollen.     Left Turbinates: Not enlarged or swollen.     Right Sinus: No frontal sinus tenderness.     Left Sinus: No maxillary sinus tenderness or frontal sinus tenderness.     Mouth/Throat:     Lips: Pink.     Mouth: Mucous membranes are moist.     Tongue: No lesions.     Pharynx: Oropharynx is clear. No pharyngeal swelling or posterior oropharyngeal erythema.     Tonsils: No tonsillar exudate or tonsillar abscesses.  Eyes:     General: Lids are normal. Lids are everted, no foreign bodies appreciated. Gaze aligned appropriately. No allergic shiner, visual field deficit or scleral icterus.       Right eye: No foreign body, discharge or hordeolum.        Left eye: No foreign body, discharge or hordeolum.     Extraocular Movements: Extraocular movements intact.     Right eye: Normal extraocular motion and no nystagmus.     Left eye: Normal extraocular motion and no nystagmus.     Conjunctiva/sclera:     Right eye: Right conjunctiva is injected  (diffuse). No chemosis, exudate or hemorrhage.    Left eye: Left conjunctiva is injected (minimal). No chemosis, exudate or hemorrhage.    Pupils: Pupils are equal, round, and reactive to light.     Right eye: Pupil is round, reactive and not sluggish.  Left eye: Pupil is round, reactive and not sluggish.     Visual Fields: Right eye visual fields normal and left eye visual fields normal.     UC Treatments / Results  Labs (all labs ordered are listed, but only abnormal results are displayed) Labs Reviewed - No data to display  EKG   Radiology No results found.  Procedures Procedures (including critical care time)  Medications Ordered in UC Medications - No data to display  Initial Impression / Assessment and Plan / UC Course  I have reviewed the triage vital signs and the nursing notes.  Pertinent labs & imaging results that were available during my care of the patient were reviewed by me and considered in my medical decision making (see chart for details).     Scleritis - discussed with pt this is likely secondary to another process, possibly autoimmune. Given his clinical sx, would like him to see ophthalmology in the morning to have tonometry testing to rule out acute angle-closure glaucoma, although unlikely given the recurrence of his symptoms for the past 63-month.  Due to some resolution with his topical moxifloxacin, patient may continue this until seen by specialist.  He also does have symptoms consistent with allergies and takes over-the-counter Claritin, so we will have him start with topical Pazeo tomorrow after being seen by specialist.  No signs of periorbital cellulitis or other emergent condition. 2.   Elevated blood pressure in office -no documented history of hypertension, patient is not on medications.  Likely secondary to discomfort.  Rechecked by provider in office improved at 138/92.   Final Clinical Impressions(s) / UC Diagnoses   Final diagnoses:   Scleritis of right eye     Discharge Instructions      MUST call ophthalmologist tomorrow for STAT office visit.  Need to have intraocular pressure tested to rule out acute angle closure glaucoma. Due to recurrence, an autoimmune issue is of concern and thus labs should be obtained by eye specialist or PCP. You may continue your vigamox, you may use in both eyes. Do not start the drops called in today until after seeing specialist in the morning. For ANY worsening symptoms, head straight to the ER.     ED Prescriptions     Medication Sig Dispense Auth. Provider   Olopatadine HCl (PAZEO) 0.7 % SOLN Apply 1 drop to eye daily for 7 days. 4 mL Britlee Skolnik L, PA      PDMP not reviewed this encounter.   Chaney Malling, [place] 03/07/21 2052

## 2021-04-12 ENCOUNTER — Ambulatory Visit: Payer: Self-pay | Admitting: Nurse Practitioner

## 2021-06-11 ENCOUNTER — Emergency Department (HOSPITAL_COMMUNITY)
Admission: EM | Admit: 2021-06-11 | Discharge: 2021-06-11 | Disposition: A | Discharge status: Home / Self Care | Payer: 59 | Attending: Emergency Medicine | Admitting: Emergency Medicine

## 2021-06-11 ENCOUNTER — Other Ambulatory Visit: Payer: Self-pay

## 2021-06-11 ENCOUNTER — Encounter (HOSPITAL_COMMUNITY): Payer: Self-pay

## 2021-06-11 ENCOUNTER — Other Ambulatory Visit (HOSPITAL_COMMUNITY): Payer: Self-pay

## 2021-06-11 DIAGNOSIS — H538 Other visual disturbances: Secondary | ICD-10-CM | POA: Diagnosis not present

## 2021-06-11 DIAGNOSIS — R110 Nausea: Secondary | ICD-10-CM | POA: Diagnosis not present

## 2021-06-11 DIAGNOSIS — R519 Headache, unspecified: Secondary | ICD-10-CM | POA: Diagnosis not present

## 2021-06-11 DIAGNOSIS — Z20822 Contact with and (suspected) exposure to covid-19: Secondary | ICD-10-CM | POA: Diagnosis not present

## 2021-06-11 DIAGNOSIS — R197 Diarrhea, unspecified: Secondary | ICD-10-CM | POA: Diagnosis present

## 2021-06-11 LAB — RESP PANEL BY RT-PCR (FLU A&B, COVID) ARPGX2
Influenza A by PCR: NEGATIVE
Influenza B by PCR: NEGATIVE
SARS Coronavirus 2 by RT PCR: NEGATIVE

## 2021-06-11 MED ORDER — LOPERAMIDE HCL 2 MG PO CAPS
2.0000 mg | ORAL_CAPSULE | Freq: Four times a day (QID) | ORAL | 0 refills | Status: DC | PRN
  Filled 2021-06-11: qty 12, 3d supply, fill #0

## 2021-06-11 MED ORDER — LOPERAMIDE HCL 2 MG PO CAPS
4.0000 mg | ORAL_CAPSULE | Freq: Once | ORAL | Status: AC
  Administered 2021-06-11: 4 mg via ORAL
  Filled 2021-06-11: qty 2

## 2021-06-11 NOTE — ED Triage Notes (Signed)
Patient arrived stating over the last week he has had a headache, cough, and diarrhea. Also reports some bilateral eye discomfort he has been treating at home with eye drops.   ?

## 2021-06-11 NOTE — Discharge Instructions (Signed)
Please read and follow all provided instructions. ? ?Your diagnoses today include:  ?1. Diarrhea, unspecified type   ?2. Blurry vision, bilateral   ? ? ?Tests performed today include: ?COVID/flu testing: Was negative ?Vital signs. See below for your results today.  ? ?Medications prescribed:  ?Imodium: Medication for diarrhea, you may take a dose after every loose stool, up to 8 times daily ? ?Take any prescribed medications only as directed. ? ?Home care instructions:  ?Follow any educational materials contained in this packet. ?Continue good hydration at home and foods that are not hard on the stomach ? ?Follow-up instructions: ?Please follow-up with your primary care provider in the next 2 days for further evaluation of your symptoms.   ? ?Return instructions:  ?SEEK IMMEDIATE MEDICAL ATTENTION IF: ?The pain does not go away or becomes severe  ?A temperature above 101F develops  ?Repeated vomiting occurs (multiple episodes)  ?The pain becomes localized to portions of the abdomen. The right side could possibly be appendicitis. In an adult, the left lower portion of the abdomen could be colitis or diverticulitis.  ?Blood is being passed in stools or vomit (bright red or black tarry stools)  ?You develop chest pain, difficulty breathing, dizziness or fainting, or become confused, poorly responsive, or inconsolable (young children) ?If you have any other emergent concerns regarding your health ? ?Your vital signs today were: ?BP (!) 124/97 (BP Location: Left Arm)   Pulse 99   Temp 98.3 ?F (36.8 ?C) (Oral)   Resp 17   Ht 5' 10.5" (1.791 m)   Wt 83.9 kg   SpO2 96%   BMI 26.17 kg/m?  ?If your blood pressure (bp) was elevated above 135/85 this visit, please have this repeated by your doctor within one month. ?-------------- ? ?

## 2021-06-11 NOTE — ED Provider Notes (Signed)
?[facility]-EMERGENCY DEPT ?Provider Note ? ? ?CSN: [id number] ?Arrival date & time: 06/11/21  7517 ? ?  ? ?History ? ?Chief Complaint  ?Patient presents with  ? Diarrhea  ? ? ?Jimmy Powers is a 26 y.o. male. ? ?Patient presents to the emergency department tonight for multiple complaints.  He has a history of Bell's palsy last year with some residual right-sided facial weakness.  He also has an unspecified eye problem which he has taken drops for in the past and has seen an ophthalmologist.  Patient presents reporting cough that started about a week ago.  He began taking over-the-counter cough medication which has improved his cough and this is getting better.  No associated fevers, ear pain, sore throat.  Although he developed diarrhea, nonbloody and watery, over the past 2 days and was particularly bad at work early this morning prompting emergency department visit.  He has had occasional abdominal cramping associated with episodes of diarrhea but no constant pain.  Associated nausea "when I move too fast at work" otherwise no vomiting.  No blood noted in the stool.  No urinary symptoms.  He also reports having headaches and blurry vision like he did with symptoms last year.  No loss of vision.  No swelling around the eyes.  Patient denies history of autoimmune diseases including Crohn's disease and ulcerative colitis.  He denies being on any steroids or immunocompromising medications since treatment for Bell's palsy. ? ? ?  ? ?Home Medications ?Prior to Admission medications   ?Medication Sig Start Date End Date Taking? Authorizing Provider  ?fluticasone (FLONASE) 50 MCG/ACT nasal spray Place 2 sprays into both nostrils daily. 08/13/20   Domenick Gong, MD  ?moxifloxacin (VIGAMOX) 0.5 % ophthalmic solution Place 1 drop into the left eye in the morning, at noon, in the evening, and at bedtime. 02/02/21   Virgina Norfolk, DO  ?cetirizine (ZYRTEC ALLERGY) 10 MG tablet Take 1 tablet (10 mg total)  by mouth daily. 06/08/20 07/20/20  Wallis Bamberg, PA-C  ?   ? ?Allergies    ?Amoxicillin   ? ?Review of Systems   ?Review of Systems ? ?Physical Exam ?Updated Vital Signs ?BP (!) 124/97 (BP Location: Left Arm)   Pulse 99   Temp 98.3 ?F (36.8 ?C) (Oral)   Resp 17   Ht 5' 10.5" (1.791 m)   Wt 83.9 kg   SpO2 96%   BMI 26.17 kg/m?  ? ?Physical Exam ?Vitals and nursing note reviewed.  ?Constitutional:   ?   General: He is not in acute distress. ?   Appearance: He is well-developed.  ?HENT:  ?   Head: Normocephalic and atraumatic.  ?   Jaw: No trismus.  ?   Right Ear: Tympanic membrane, ear canal and external ear normal.  ?   Left Ear: Tympanic membrane, ear canal and external ear normal.  ?   Nose: Nose normal. No mucosal edema or rhinorrhea.  ?   Mouth/Throat:  ?   Mouth: Mucous membranes are not dry.  ?   Pharynx: Uvula midline. No oropharyngeal exudate, posterior oropharyngeal erythema or uvula swelling.  ?   Tonsils: No tonsillar abscesses.  ?Eyes:  ?   General:     ?   Right eye: No discharge.     ?   Left eye: No discharge.  ?   Comments: Slight bilateral conjunctival injection.  No exudates or drainage.  ?Cardiovascular:  ?   Rate and Rhythm: Normal rate and regular rhythm.  ?  Heart sounds: Normal heart sounds.  ?Pulmonary:  ?   Effort: Pulmonary effort is normal. No respiratory distress.  ?   Breath sounds: Normal breath sounds. No wheezing or rales.  ?Abdominal:  ?   Palpations: Abdomen is soft.  ?   Tenderness: There is no abdominal tenderness.  ?   Comments: Abdomen is soft and nontender.  ?Musculoskeletal:  ?   Cervical back: Normal range of motion and neck supple.  ?Skin: ?   General: Skin is warm and dry.  ?Neurological:  ?   Mental Status: He is alert.  ?   Comments: When patient raises his eyebrows he has mild weakness of the right forehead.  ? ? ?ED Results / Procedures / Treatments   ?Labs ?(all labs ordered are listed, but only abnormal results are displayed) ?Labs Reviewed  ?RESP PANEL BY RT-PCR  (FLU A&B, COVID) ARPGX2  ? ? ?EKG ?None ? ?Radiology ?No results found. ? ?Procedures ?Procedures  ? ? ?Medications Ordered in ED ?Medications  ?loperamide (IMODIUM) capsule 4 mg (4 mg Oral Given 06/11/21 0459)  ? ? ?ED Course/ Medical Decision Making/ A&P ?  ? ?Patient seen and examined. History obtained directly from patient and previous ED visit notes. ? ?Labs/EKG: Ordered COVID testing due to recent cough and GI symptoms. ? ?Imaging: None ordered ? ?Medications/Fluids: Imodium as diarrhea is the patient's most pressing symptom. ? ?Most recent vital signs reviewed and are as follows: ?BP (!) 124/97 (BP Location: Left Arm)   Pulse 99   Temp 98.3 ?F (36.8 ?C) (Oral)   Resp 17   Ht 5' 10.5" (1.791 m)   Wt 83.9 kg   SpO2 96%   BMI 26.17 kg/m?  ? ?Initial impression: Diarrhea, possible viral illness.  Will focus on symptom control at this point.  Patient does not appear to be significantly dehydrated.  He continues to be able to eat.  Low concern for significant electrolyte abnormality.  We will have him eat and drink something here while we await COVID testing. ? ?  Visual Acuity ? ?Right Eye Distance:   ?Left Eye Distance:   ?Bilateral Distance:   ? ?Right Eye Near: R Near: 20/40 ?Left Eye Near:  L Near: 20/50 ?Bilateral Near:    ? ?6:14 AM Reassessment performed. Patient appears comfortable.  No further diarrhea.  He is drinking ginger ale at bedside without difficulties. ? ?Labs personally reviewed and interpreted including: Negative COVID and flu testing ? ?Reviewed pertinent lab work and imaging with patient at bedside. Questions answered.  ? ?Most current vital signs reviewed and are as follows: ?BP (!) 124/97 (BP Location: Left Arm)   Pulse 99   Temp 98.3 ?F (36.8 ?C) (Oral)   Resp 17   Ht 5' 10.5" (1.791 m)   Wt 83.9 kg   SpO2 96%   BMI 26.17 kg/m?  ? ?Plan: Discharge to home.  ? ?Prescriptions written for: Imodium ? ?Other home care instructions discussed: Ophthalmology follow-up if he continues  to have eye symptoms ? ?ED return instructions discussed: The patient was urged to return to the Emergency Department immediately with worsening of current symptoms, worsening abdominal pain, persistent vomiting, blood noted in stools, fever, or any other concerns. The patient verbalized understanding.  ? ?Follow-up instructions discussed: Patient encouraged to follow-up with their PCP in 7 days.  ? ? ? ? ? ?                        ?  Medical Decision Making ?Risk ?Prescription drug management. ? ? ?Patient here mainly for diarrhea symptoms over the past 2 days.  He has had a cough and more upper respiratory symptoms over the past week which are improving.  No abdominal tenderness.  He has cramping only with bowel movements.  Benign abdominal exam.  He is eating and drinking.  Do not feel that he requires further work-up with labs today.  Do not feel that he requires CT imaging.  Will treat symptomatically. ? ?He also reports blurry vision.  No loss of vision.  No significant eye pain.  He is taking drops.  He has establish care with ophthalmology.  Thinks that he may have been diagnosed with uveitis before, but clinically does not have this today.  Comfortable with outpatient follow-up, return with worsening, at this time. ? ? ? ? ? ? ? ?Final Clinical Impression(s) / ED Diagnoses ?Final diagnoses:  ?Diarrhea, unspecified type  ?Blurry vision, bilateral  ? ? ?Rx / DC Orders ?ED Discharge Orders   ? ?      Ordered  ?  loperamide (IMODIUM) 2 MG capsule  4 times daily PRN       ? 06/11/21 5956  ? ?  ?  ? ?  ? ? ?  ?Renne Crigler, PA-C ?06/11/21 3875 ? ?  ?Paula Libra, MD ?06/11/21 [phone] ? ?

## 2021-06-28 ENCOUNTER — Emergency Department (HOSPITAL_COMMUNITY)
Admission: EM | Admit: 2021-06-28 | Discharge: 2021-06-28 | Disposition: A | Discharge status: Home / Self Care | Payer: 59 | Attending: Emergency Medicine | Admitting: Emergency Medicine

## 2021-06-28 ENCOUNTER — Encounter (HOSPITAL_COMMUNITY): Payer: Self-pay | Admitting: Emergency Medicine

## 2021-06-28 DIAGNOSIS — H1189 Other specified disorders of conjunctiva: Secondary | ICD-10-CM | POA: Insufficient documentation

## 2021-06-28 DIAGNOSIS — H5789 Other specified disorders of eye and adnexa: Secondary | ICD-10-CM

## 2021-06-28 DIAGNOSIS — H5711 Ocular pain, right eye: Secondary | ICD-10-CM | POA: Diagnosis present

## 2021-06-28 MED ORDER — FLUORESCEIN SODIUM 1 MG OP STRP
1.0000 | ORAL_STRIP | Freq: Once | OPHTHALMIC | Status: AC
Start: 2021-06-28 — End: 2021-06-28
  Administered 2021-06-28: 1 via OPHTHALMIC
  Filled 2021-06-28: qty 1

## 2021-06-28 MED ORDER — TETRACAINE HCL 0.5 % OP SOLN
1.0000 [drp] | Freq: Once | OPHTHALMIC | Status: AC
  Administered 2021-06-28: 1 [drp] via OPHTHALMIC
  Filled 2021-06-28: qty 4

## 2021-06-28 NOTE — Discharge Instructions (Signed)
As we discussed, I have called Dr Vonna Kotyk who is an ophthalmologist.  He has agreed to see you at 9 PM tonight at his office on [address] when you get there, please call his cell phone at [phone] as the door will be locked since it is after normal office hours. ? ?Return if development of any new or worsening symptoms. ?

## 2021-06-28 NOTE — ED Provider Notes (Signed)
?[facility]-EMERGENCY DEPT ?Provider Note ? ? ?CSN: [id number] ?Arrival date & time: 06/28/21  1738 ? ?  ? ?History ? ?Chief Complaint  ?Patient presents with  ? Eye Problem  ? ? ?Jimmy Powers is a 26 y.o. male. ? 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  He states that when he woke up this morning he had sharp stabbing pain and blurred vision in his right eye.  States that he used some over-the-counter saline eyedrops for same without relief.  He states that he had a similar episode of this last year around the same time and was diagnosed with uveitis and seeing ophthalmology for same.  States that he was given several different prescription eyedrops which gave him some relief but were not curative.  States that he not have full relief of his symptoms until winter of last year.  He does not wear contact lenses and denies any recent exposures.  He does wear glasses. ? ?The history is provided by the patient. No language interpreter was used.  ?Eye Problem ?Associated symptoms: photophobia and redness   ?Associated symptoms: no headaches, no numbness and no weakness   ? ?  ? ?Home Medications ?Prior to Admission medications   ?Medication Sig Start Date End Date Taking? Authorizing Provider  ?fluticasone (FLONASE) 50 MCG/ACT nasal spray Place 2 sprays into both nostrils daily. 08/13/20   Domenick Gong, MD  ?loperamide (IMODIUM) 2 MG capsule Take 1 capsule (2 mg total) by mouth 4 (four) times daily as needed for diarrhea or loose stools. 06/11/21   Renne Crigler, PA-C  ?moxifloxacin (VIGAMOX) 0.5 % ophthalmic solution Place 1 drop into the left eye in the morning, at noon, in the evening, and at bedtime. 02/02/21   Virgina Norfolk, DO  ?cetirizine (ZYRTEC ALLERGY) 10 MG tablet Take 1 tablet (10 mg total) by mouth daily. 06/08/20 07/20/20  Wallis Bamberg, PA-C  ?   ? ?Allergies    ?Amoxicillin   ? ?Review of Systems   ?Review of Systems   ?Constitutional:  Negative for chills and fever.  ?Eyes:  Positive for photophobia, redness and visual disturbance.  ?Neurological:  Negative for dizziness, tremors, seizures, syncope, facial asymmetry, speech difficulty, weakness, light-headedness, numbness and headaches.  ?All other systems reviewed and are negative. ? ?Physical Exam ?Updated Vital Signs ?BP 129/78 (BP Location: Right Arm)   Pulse 82   Temp 98.5 ?F (36.9 ?C) (Oral)   Resp 18   SpO2 97%  ?Physical Exam ?Vitals and nursing note reviewed.  ?Constitutional:   ?   General: He is not in acute distress. ?   Appearance: Normal appearance. He is normal weight. He is not ill-appearing, toxic-appearing or diaphoretic.  ?HENT:  ?   Head: Normocephalic and atraumatic.  ?Eyes:  ?   General: Lids are normal. Lids are everted, no foreign bodies appreciated.     ?   Right eye: No foreign body, discharge or hordeolum.  ?   Intraocular pressure: Right eye pressure is 20 mmHg. Measurements were taken using a handheld tonometer. ?   Extraocular Movements: Extraocular movements intact.  ?   Right eye: Normal extraocular motion and no nystagmus.  ?   Left eye: Normal extraocular motion and no nystagmus.  ?   Pupils: Pupils are equal, round, and reactive to light.  ?   Comments: Erythematous conjunctivae in the right eye, EOMs intact. No chemosis or exopthalmos. No hyphema or periorbital swelling.  Woods lamp exam with fluorescin stain unremarkable, no corneal abrasion or ulceration   ?Cardiovascular:  ?   Rate and Rhythm: Normal rate.  ?Pulmonary:  ?   Effort: Pulmonary effort is normal. No respiratory distress.  ?Musculoskeletal:     ?   General: Normal range of motion.  ?   Cervical back: Normal range of motion.  ?Skin: ?   General: Skin is warm and dry.  ?Neurological:  ?   General: No focal deficit present.  ?   Mental Status: He is alert.  ?Psychiatric:     ?   Mood and Affect: Mood normal.     ?   Behavior: Behavior normal.  ? ? ?ED Results / Procedures /  Treatments   ?Labs ?(all labs ordered are listed, but only abnormal results are displayed) ?Labs Reviewed - No data to display ? ?EKG ?None ? ?Radiology ?No results found. ? ?Procedures ?Procedures  ? ? ?Medications Ordered in ED ?Medications  ?fluorescein ophthalmic strip 1 strip (1 strip Right Eye Given by Other 06/28/21 1839)  ?tetracaine (PONTOCAINE) 0.5 % ophthalmic solution 1 drop (1 drop Right Eye Given by Other 06/28/21 1839)  ? ? ?ED Course/ Medical Decision Making/ A&P ?  ?                        ?Medical Decision Making ?Risk ?Prescription drug management. ? ? ?Patient presents today with pain and conjunctival erythema to the right eye. Patient endorses history of uveitis last year around the same time of year, was following opthalmology for same. He is unable to recall what ophthalmologist he saw at that time.  ? ?Upon exam, no evidence of HSV or VSV infection. Pt is not a contact lens wearer.  Exam non-concerning for orbital cellulitis, hyphema, corneal ulcers, corneal abrasions or trauma.   ? ?Discussed patient with ophthalmology Dr. Vonna Kotyk who requests patient discharge and he will see the patient in his office in 30 minutes at 9 pm for same. Patient understanding and amenable with plan. Will defer to ophthalmology for medication recommendations. Educated on red flag symptoms that would prompt immediate return. Discharged in stable condition. ? ?Final Clinical Impression(s) / ED Diagnoses ?Final diagnoses:  ?Eye irritation  ? ? ?Rx / DC Orders ?ED Discharge Orders   ? ? None  ? ?  ?An After Visit Summary was printed and given to the patient. ? ? ?  ?Silva Bandy, PA-C ?06/30/21 0272 ? ?  ?Tegeler, Canary Brim, MD ?07/01/21 2008 ? ?

## 2021-06-28 NOTE — ED Notes (Signed)
An After Visit Summary was printed and given to the patient. Discharge instructions given and no further questions at this time.  Pt leaving with family.  

## 2021-06-28 NOTE — ED Notes (Signed)
Attempted to do visual acuity on pt- pt reports blurred vision and not being able to see anything. PA aware.  ?

## 2021-06-28 NOTE — ED Triage Notes (Signed)
Per pt, states having light sensitivity and irritation to right eye-same thing happened to him last year-has seen ophthalmologist for issue but says it is a temporary fix. ?

## 2021-07-23 ENCOUNTER — Emergency Department (HOSPITAL_COMMUNITY)
Admission: EM | Admit: 2021-07-23 | Discharge: 2021-07-23 | Disposition: A | Discharge status: Home / Self Care | Payer: 59 | Attending: Emergency Medicine | Admitting: Emergency Medicine

## 2021-07-23 ENCOUNTER — Other Ambulatory Visit (HOSPITAL_COMMUNITY): Payer: Self-pay

## 2021-07-23 DIAGNOSIS — R21 Rash and other nonspecific skin eruption: Secondary | ICD-10-CM | POA: Diagnosis not present

## 2021-07-23 DIAGNOSIS — M25571 Pain in right ankle and joints of right foot: Secondary | ICD-10-CM | POA: Diagnosis not present

## 2021-07-23 DIAGNOSIS — M25561 Pain in right knee: Secondary | ICD-10-CM | POA: Diagnosis not present

## 2021-07-23 DIAGNOSIS — M25572 Pain in left ankle and joints of left foot: Secondary | ICD-10-CM | POA: Diagnosis not present

## 2021-07-23 DIAGNOSIS — M25562 Pain in left knee: Secondary | ICD-10-CM | POA: Insufficient documentation

## 2021-07-23 LAB — CBC WITH DIFFERENTIAL/PLATELET
Abs Immature Granulocytes: 0.03 10*3/uL (ref 0.00–0.07)
Basophils Absolute: 0 10*3/uL (ref 0.0–0.1)
Basophils Relative: 1 %
Eosinophils Absolute: 0.4 10*3/uL (ref 0.0–0.5)
Eosinophils Relative: 7 %
HCT: 42.4 % (ref 39.0–52.0)
Hemoglobin: 13.9 g/dL (ref 13.0–17.0)
Immature Granulocytes: 1 %
Lymphocytes Relative: 14 %
Lymphs Abs: 0.9 10*3/uL (ref 0.7–4.0)
MCH: 24.9 pg — ABNORMAL LOW (ref 26.0–34.0)
MCHC: 32.8 g/dL (ref 30.0–36.0)
MCV: 76 fL — ABNORMAL LOW (ref 80.0–100.0)
Monocytes Absolute: 0.6 10*3/uL (ref 0.1–1.0)
Monocytes Relative: 10 %
Neutro Abs: 4.1 10*3/uL (ref 1.7–7.7)
Neutrophils Relative %: 67 %
Platelets: 210 10*3/uL (ref 150–400)
RBC: 5.58 MIL/uL (ref 4.22–5.81)
RDW: 13.2 % (ref 11.5–15.5)
WBC: 6 10*3/uL (ref 4.0–10.5)
nRBC: 0 % (ref 0.0–0.2)

## 2021-07-23 LAB — COMPREHENSIVE METABOLIC PANEL
ALT: 26 U/L (ref 0–44)
AST: 26 U/L (ref 15–41)
Albumin: 3.9 g/dL (ref 3.5–5.0)
Alkaline Phosphatase: 95 U/L (ref 38–126)
Anion gap: 9 (ref 5–15)
BUN: 10 mg/dL (ref 6–20)
CO2: 26 mmol/L (ref 22–32)
Calcium: 9.1 mg/dL (ref 8.9–10.3)
Chloride: 105 mmol/L (ref 98–111)
Creatinine, Ser: 0.94 mg/dL (ref 0.61–1.24)
GFR, Estimated: >60 mL/min (ref >60)
Glucose, Bld: 91 mg/dL (ref 70–99)
Potassium: 3.5 mmol/L (ref 3.5–5.1)
Sodium: 140 mmol/L (ref 135–145)
Total Bilirubin: 1 mg/dL (ref 0.3–1.2)
Total Protein: 7.9 g/dL (ref 6.5–8.1)

## 2021-07-23 LAB — C-REACTIVE PROTEIN: CRP: 2.3 mg/dL — ABNORMAL HIGH (ref <1.0)

## 2021-07-23 LAB — SEDIMENTATION RATE: Sed Rate: 12 mm/hr (ref 0–16)

## 2021-07-23 MED ORDER — PREDNISONE 10 MG (21) PO TBPK
ORAL_TABLET | Freq: Every day | ORAL | 0 refills | Status: DC
  Filled 2021-07-23: qty 42, 12d supply, fill #0

## 2021-07-23 MED ORDER — PREDNISONE 10 MG (21) PO TBPK
ORAL_TABLET | Freq: Every day | ORAL | 0 refills | Status: DC
Start: 2021-07-23 — End: 2022-01-29

## 2021-07-23 NOTE — ED Provider Notes (Signed)
?[facility] DEPT ?Provider Note ? ? ?CSN: [id number] ?Arrival date & time: 07/23/21  0601 ? ?  ? ?History ?PMH: appendectomy ?Chief Complaint  ?Patient presents with  ? Rash  ? Joint Pain  ? ? ?Jimmy Powers is a 26 y.o. male. ?Patient presents with chief complaint of rash.  Rash came on about 2 days ago all over patient's bilateral arms, torso, and bilateral legs.  It is only present over his tattoo sites which have been there for several years.  Rash is not itchy.  No association with recent medication changes.  He does have some associated bilateral knee joint pain as well as ankle pain.  It is worse on the right knee and ankle.  He is weightbearing.  No recent injury to the area.  He describes it as aching.  He said he had similar symptoms about a year ago with the rash and the joint pain.  He was seen here and was given a course of prednisone which did not get rid of the rash, but did relieve the itching.  He says the rash and pains, or intermittent throughout the year and got better once he got cold.  They seem to have recurred for the first time this year. ? ? ?Rash ?Associated symptoms: joint pain   ? ?  ? ?Home Medications ?Prior to Admission medications   ?Medication Sig Start Date End Date Taking? Authorizing Provider  ?fluticasone (FLONASE) 50 MCG/ACT nasal spray Place 2 sprays into both nostrils daily. 08/13/20   Melynda Ripple, MD  ?loperamide (IMODIUM) 2 MG capsule Take 1 capsule (2 mg total) by mouth 4 (four) times daily as needed for diarrhea or loose stools. 06/11/21   Carlisle Cater, PA-C  ?moxifloxacin (VIGAMOX) 0.5 % ophthalmic solution Place 1 drop into the left eye in the morning, at noon, in the evening, and at bedtime. 02/02/21   Curatolo, Adam, DO  ?predniSONE (STERAPRED UNI-PAK 21 TAB) 10 MG (21) TBPK tablet Take 6 tabs by mouth daily  for 2 days, then 5 tabs for 2 days, then 4 tabs for 2 days, then 3 tabs for 2 days, 2 tabs for 2 days, then 1 tab by mouth daily  for 2 days 07/23/21   Tamir Wallman, Adora Fridge, PA-C  ?cetirizine (ZYRTEC ALLERGY) 10 MG tablet Take 1 tablet (10 mg total) by mouth daily. 06/08/20 07/20/20  Jaynee Eagles, PA-C  ?   ? ?Allergies    ?Amoxicillin   ? ?Review of Systems   ?Review of Systems  ?Musculoskeletal:  Positive for arthralgias.  ?Skin:  Positive for rash.  ?All other systems reviewed and are negative. ? ?Physical Exam ?Updated Vital Signs ?BP (!) 141/82   Pulse 79   Temp 97.9 ?F (36.6 ?C) (Oral)   Resp 15   Ht 5' 11"  (1.803 m)   Wt 83.9 kg   SpO2 97%   BMI 25.80 kg/m?  ?Physical Exam ?Vitals and nursing note reviewed.  ?Constitutional:   ?   General: He is not in acute distress. ?   Appearance: Normal appearance. He is well-developed. He is not ill-appearing, toxic-appearing or diaphoretic.  ?HENT:  ?   Head: Normocephalic and atraumatic.  ?   Nose: No nasal deformity.  ?   Mouth/Throat:  ?   Lips: Pink. No lesions.  ?Eyes:  ?   General: Gaze aligned appropriately. No scleral icterus.    ?   Right eye: No discharge.     ?   Left eye: No  discharge.  ?   Conjunctiva/sclera: Conjunctivae normal.  ?   Right eye: Right conjunctiva is not injected. No exudate or hemorrhage. ?   Left eye: Left conjunctiva is not injected. No exudate or hemorrhage. ?Pulmonary:  ?   Effort: Pulmonary effort is normal. No respiratory distress.  ?Musculoskeletal:  ?   Right lower leg: No edema.  ?   Left lower leg: No edema.  ?   Comments: Bilateral knee and ankle without swelling, redness, or tenderness.  Full range of motion noted.  Full strength.  Sensation intact.  Pedal pulses 2+ bilaterally  ?Skin: ?   General: Skin is warm and dry.  ?   Findings: Rash present. Rash is macular and scaling. Rash is not crusting, nodular, papular, purpuric, pustular, urticarial or vesicular.  ?   Comments: Macular rash present overlying tattoo sites.  There is some scaly dry skin as well.  Does not appear urticarial.  See photos below  ?Neurological:  ?   Mental Status: He is alert and  oriented to person, place, and time.  ?Psychiatric:     ?   Mood and Affect: Mood normal.     ?   Speech: Speech normal.     ?   Behavior: Behavior normal. Behavior is cooperative.  ? ? ? ? ? ? ? ? ? ?ED Results / Procedures / Treatments   ?Labs ?(all labs ordered are listed, but only abnormal results are displayed) ?Labs Reviewed  ?CBC WITH DIFFERENTIAL/PLATELET - Abnormal; Notable for the following components:  ?    Result Value  ? MCV 76.0 (*)   ? MCH 24.9 (*)   ? All other components within normal limits  ?COMPREHENSIVE METABOLIC PANEL  ?SEDIMENTATION RATE  ?C-REACTIVE PROTEIN  ?RHEUMATOID FACTOR  ? ? ?EKG ?None ? ?Radiology ?No results found. ? ?Procedures ?Procedures  ? ? ?Medications Ordered in ED ?Medications - No data to display ? ?ED Course/ Medical Decision Making/ A&P ?  ?                        ?Medical Decision Making ?Amount and/or Complexity of Data Reviewed ?Labs: ordered. ? ?Risk ?Prescription drug management. ? ? ? ?MDM  ?This is a 26 y.o. male who presents to the ED with rash and polyarthritis ?The differential of this patient includes but is not limited to heat rash, contact dermatitis, rheumatological disorder ? ?My Impression, Plan, and ED Course: Patient is well-appearing in no acute distress.  He presents for the second onset of rash and associated polyarthralgias.  Rash does seem to be associated with heat, so this is possibly why it is occurring.  Does not look like a lyme rash. Given the associated joint pain, and recurrent nature, will get basic labs and inflammatory markers to assess for possible rheumatology problem. ? ?I personally ordered, reviewed, and interpreted all laboratory work and imaging and agree with radiologist interpretation. Results interpreted below: CBC reassuring. CMP normal. ESR normal. CRP and RF pending ? ?I discussed results with patient. There are still pending ESR and RF results pending, but I do not think this will change management. Patient ultimately needs  to establish care. I will give these resources for patient to set up. I will give prednisone dose pack for rash. Return precautions provided.  ? ? ? ?Charting Requirements ?Additional history is obtained from:  Independent historian ?External Records from outside source obtained and reviewed including: Reviewed Prior labs. Never had Inflammatory markers or  RF. ?Social Determinants of Health:  Access to medical care and no PCP ?Pertinant PMH that complicates patient's illness: n/a ? ?Patient Care ?Problems that were addressed during this visit: ?- Rash: Acute illness with systemic symptoms ?Disposition: f/u with pcp ? ?This is a supervised visit with my attending physician, Dr. Earl Lites. We have discussed this patient and they have altered the plan as needed. ? ?Portions of this note were generated with [employer]. Dictation errors may occur despite best attempts at proofreading. ?  ? ? ?Final Clinical Impression(s) / ED Diagnoses ?Final diagnoses:  ?Rash  ? ? ?Rx / DC Orders ?ED Discharge Orders   ? ?      Ordered  ?  predniSONE (STERAPRED UNI-PAK 21 TAB) 10 MG (21) TBPK tablet  Daily,   Status:  Discontinued       ? 07/23/21 1132  ?  predniSONE (STERAPRED UNI-PAK 21 TAB) 10 MG (21) TBPK tablet  Daily       ? 07/23/21 1204  ? ?  ?  ? ?  ? ? ?  ?Adolphus Birchwood, PA-C ?07/23/21 1223 ? ?  ?Jeanell Sparrow, DO ?07/23/21 1612 ? ?

## 2021-07-23 NOTE — Discharge Instructions (Addendum)
Your workup today was unremarkable. You have two pending tests. Please follow up on these with MyChart. You need to establish care with a primary care provider to follow up on these results and to have further workup on your skin problems. I have provided you a referral to [facility]. Please call to schedule an appointment.  ?

## 2021-07-23 NOTE — ED Triage Notes (Signed)
Pt came in with c/o rash that started two days ago all over his tattoo sites. Pt states that the same thing happened this time last year. Pt also c/o bilateral leg joint pain, worse in R leg and R ankle. Joint pain started yesterday ?

## 2021-07-24 LAB — RHEUMATOID FACTOR: Rheumatoid fact SerPl-aCnc: <10 IU/mL (ref <14.0)

## 2021-08-21 ENCOUNTER — Emergency Department (HOSPITAL_COMMUNITY)
Admission: EM | Admit: 2021-08-21 | Discharge: 2021-08-21 | Disposition: A | Discharge status: Home / Self Care | Payer: Commercial Managed Care - HMO | Attending: Emergency Medicine | Admitting: Emergency Medicine

## 2021-08-21 ENCOUNTER — Other Ambulatory Visit (HOSPITAL_COMMUNITY): Payer: Self-pay

## 2021-08-21 ENCOUNTER — Other Ambulatory Visit: Payer: Self-pay

## 2021-08-21 ENCOUNTER — Encounter (HOSPITAL_COMMUNITY): Payer: Self-pay

## 2021-08-21 DIAGNOSIS — M545 Low back pain, unspecified: Secondary | ICD-10-CM | POA: Insufficient documentation

## 2021-08-21 MED ORDER — CYCLOBENZAPRINE HCL 10 MG PO TABS
10.0000 mg | ORAL_TABLET | Freq: Two times a day (BID) | ORAL | 0 refills | Status: DC | PRN
Start: 2021-08-21 — End: 2022-01-29
  Filled 2021-08-21: qty 20, 10d supply, fill #0

## 2021-08-21 NOTE — ED Provider Notes (Signed)
[facility]-EMERGENCY DEPT Provider Note   CSN: [id number] Arrival date & time: 08/21/21  [id number]     History  Chief Complaint  Patient presents with   Back Pain    Jimmy Powers is a 26 y.o. male.  HPI  Patient without significant medical history presents complaints of lower back pain.  Patient has back pain started today, he states it happened after he was lifting up a fish tank.  He states he feels pain in his lower back, pain will occasionally down to his legs, he denies any saddle paresthesias urinary incontinency bowel incontinency, denies any other trauma to the area.  He states pain is worsened with bending and movement improved with rest.  He said nothing for pain at this time.  He denies any stomach pain nausea vomiting denies any urinary symptoms.    Home Medications Prior to Admission medications   Medication Sig Start Date End Date Taking? Authorizing Provider  cyclobenzaprine (FLEXERIL) 10 MG tablet Take 1 tablet (10 mg total) by mouth 2 (two) times daily as needed for muscle spasms. 08/21/21  Yes Carroll SageFaulkner, Colleen Kotlarz J, PA-C  fluticasone (FLONASE) 50 MCG/ACT nasal spray Place 2 sprays into both nostrils daily. 08/13/20   Domenick GongMortenson, Ashley, MD  loperamide (IMODIUM) 2 MG capsule Take 1 capsule (2 mg total) by mouth 4 (four) times daily as needed for diarrhea or loose stools. 06/11/21   Renne CriglerGeiple, Joshua, PA-C  moxifloxacin (VIGAMOX) 0.5 % ophthalmic solution Place 1 drop into the left eye in the morning, at noon, in the evening, and at bedtime. 02/02/21   Curatolo, Adam, DO  predniSONE (STERAPRED UNI-PAK 21 TAB) 10 MG (21) TBPK tablet Take 6 tabs by mouth daily  for 2 days, then 5 tabs for 2 days, then 4 tabs for 2 days, then 3 tabs for 2 days, 2 tabs for 2 days, then 1 tab by mouth daily for 2 days 07/23/21   Loeffler, Finis BudGrace C, PA-C  cetirizine (ZYRTEC ALLERGY) 10 MG tablet Take 1 tablet (10 mg total) by mouth daily. 06/08/20 07/20/20  Wallis BambergMani, Mario, PA-C       Allergies    Amoxicillin    Review of Systems   Review of Systems  Constitutional:  Negative for chills and fever.  Respiratory:  Negative for shortness of breath.   Cardiovascular:  Negative for chest pain.  Gastrointestinal:  Negative for abdominal pain.  Musculoskeletal:  Positive for back pain.  Neurological:  Negative for headaches.   Physical Exam Updated Vital Signs BP 123/87   Pulse 84   Temp 98.3 F (36.8 C) (Oral)   Resp 15   Ht 5\' 11"  (1.803 m)   Wt 81.6 kg   SpO2 95%   BMI 25.10 kg/m  Physical Exam Vitals and nursing note reviewed.  Constitutional:      General: He is not in acute distress.    Appearance: He is not ill-appearing.  HENT:     Head: Normocephalic and atraumatic.     Nose: No congestion.  Eyes:     Conjunctiva/sclera: Conjunctivae normal.  Cardiovascular:     Rate and Rhythm: Normal rate and regular rhythm.     Pulses: Normal pulses.  Pulmonary:     Effort: Pulmonary effort is normal.  Musculoskeletal:     Comments: Spine was palpated was nontender to palpation no step-off or deformities noted.  He had noted perispinal tenderness in the lumbar region, he has 5 out of 5 strength neurovascular intact in the lower extremities  2+ dorsal pedal pulses, he is ambulating without difficulty.  Skin:    General: Skin is warm and dry.  Neurological:     Mental Status: He is alert.  Psychiatric:        Mood and Affect: Mood normal.    ED Results / Procedures / Treatments   Labs (all labs ordered are listed, but only abnormal results are displayed) Labs Reviewed - No data to display  EKG None  Radiology No results found.  Procedures Procedures    Medications Ordered in ED Medications - No data to display  ED Course/ Medical Decision Making/ A&P                           Medical Decision Making  This patient presents to the ED for concern of back pain, this involves an extensive number of treatment options, and is a complaint that  carries with it a high risk of complications and morbidity.  The differential diagnosis includes spina equina, fracture, dislocation, UTI    Additional history obtained:  Additional history obtained from N/A External records from outside source obtained and reviewed including previous ER notes, imaging, medical history   Co morbidities that complicate the patient evaluation  N/A  Social Determinants of Health:  No primary care provider    Lab Tests:  I Ordered, and personally interpreted labs.  The pertinent results include: N/A   Imaging Studies ordered:  I ordered imaging studies including N/A I independently visualized and interpreted imaging which showed N/A I agree with the radiologist interpretation   Cardiac Monitoring:  The patient was maintained on a cardiac monitor.  I personally viewed and interpreted the cardiac monitored which showed an underlying rhythm of: N/A   Medicines ordered and prescription drug management:  I ordered medication including N/A I have reviewed the patients home medicines and have made adjustments as needed  Critical Interventions:  N/A   Reevaluation:  Presents with back pain, tenderness around the paraspinal muscles lumbar region no red flag symptoms ambulated without difficulty recommend muscle relaxer follow-up with PCP he is agreement this plan.  Consultations Obtained:  N/A   Test Considered:  CT lumbar spine-this was deferred as my suspicion for fracture or dislocation very low at this time no traumatic injury associated with this pain, not increased risk for pathological fractures.    Rule out I have low suspicion for spinal fracture or spinal cord abnormality as patient denies urinary incontinency, retention, difficulty with bowel movements, denies saddle paresthesias.  Spine was palpated there is no step-off, crepitus or gross deformities felt, patient had 5/5 strength, full range of motion, neurovascular  fully intact in the lower extremities.  Low suspicion for UTI, Pilo, kidney stone denies any urinary symptoms no CVA tenderness.  Low suspicion for dissection or AAA presentation atypical, pain is focalized and reproducible. . Low suspicion for septic arthritis as patient denies IV drug use, skin exam was performed no erythematous, edema or warm joints noted.     Dispostion and problem list  After consideration of the diagnostic results and the patients response to treatment, I feel that the patent would benefit from discharge.  Back pain-likely muscular strain, will give muscle laxer's, recommend NSAIDs, follow-up with PCP for further evaluation.            Final Clinical Impression(s) / ED Diagnoses Final diagnoses:  Acute bilateral low back pain without sciatica    Rx / DC Orders ED  Discharge Orders          Ordered    cyclobenzaprine (FLEXERIL) 10 MG tablet  2 times daily PRN        08/21/21 0544              Carroll Sage, PA-C 08/21/21 0545    Nira Conn, MD 08/21/21 [phone]

## 2021-08-21 NOTE — Discharge Instructions (Signed)
You have been seen here for back pain. I recommend taking over-the-counter pain medications like ibuprofen and/or Tylenol every 6 as needed.  Please follow dosage and on the back of bottle.  I also recommend applying heat to the area and stretching out the muscles as this will help decrease stiffness and pain.  I have given you information on exercises please follow.  Please follow-up with PCP for reevaluation  Come back to the emergency department if you develop chest pain, shortness of breath, severe abdominal pain, uncontrolled nausea, vomiting, diarrhea.

## 2021-08-21 NOTE — ED Triage Notes (Signed)
Pt reports with lower back pain after lifting a fish tank at work yesterday.

## 2021-08-29 ENCOUNTER — Other Ambulatory Visit (HOSPITAL_COMMUNITY): Payer: Self-pay

## 2021-08-30 IMAGING — DX DG CHEST 2V
2 series · 2 of 2 positions shown · non-contrast
Comparison: 11/03/2010

CLINICAL DATA: Atypical chest pain

EXAM:
CHEST - 2 VIEW

[chest pa]
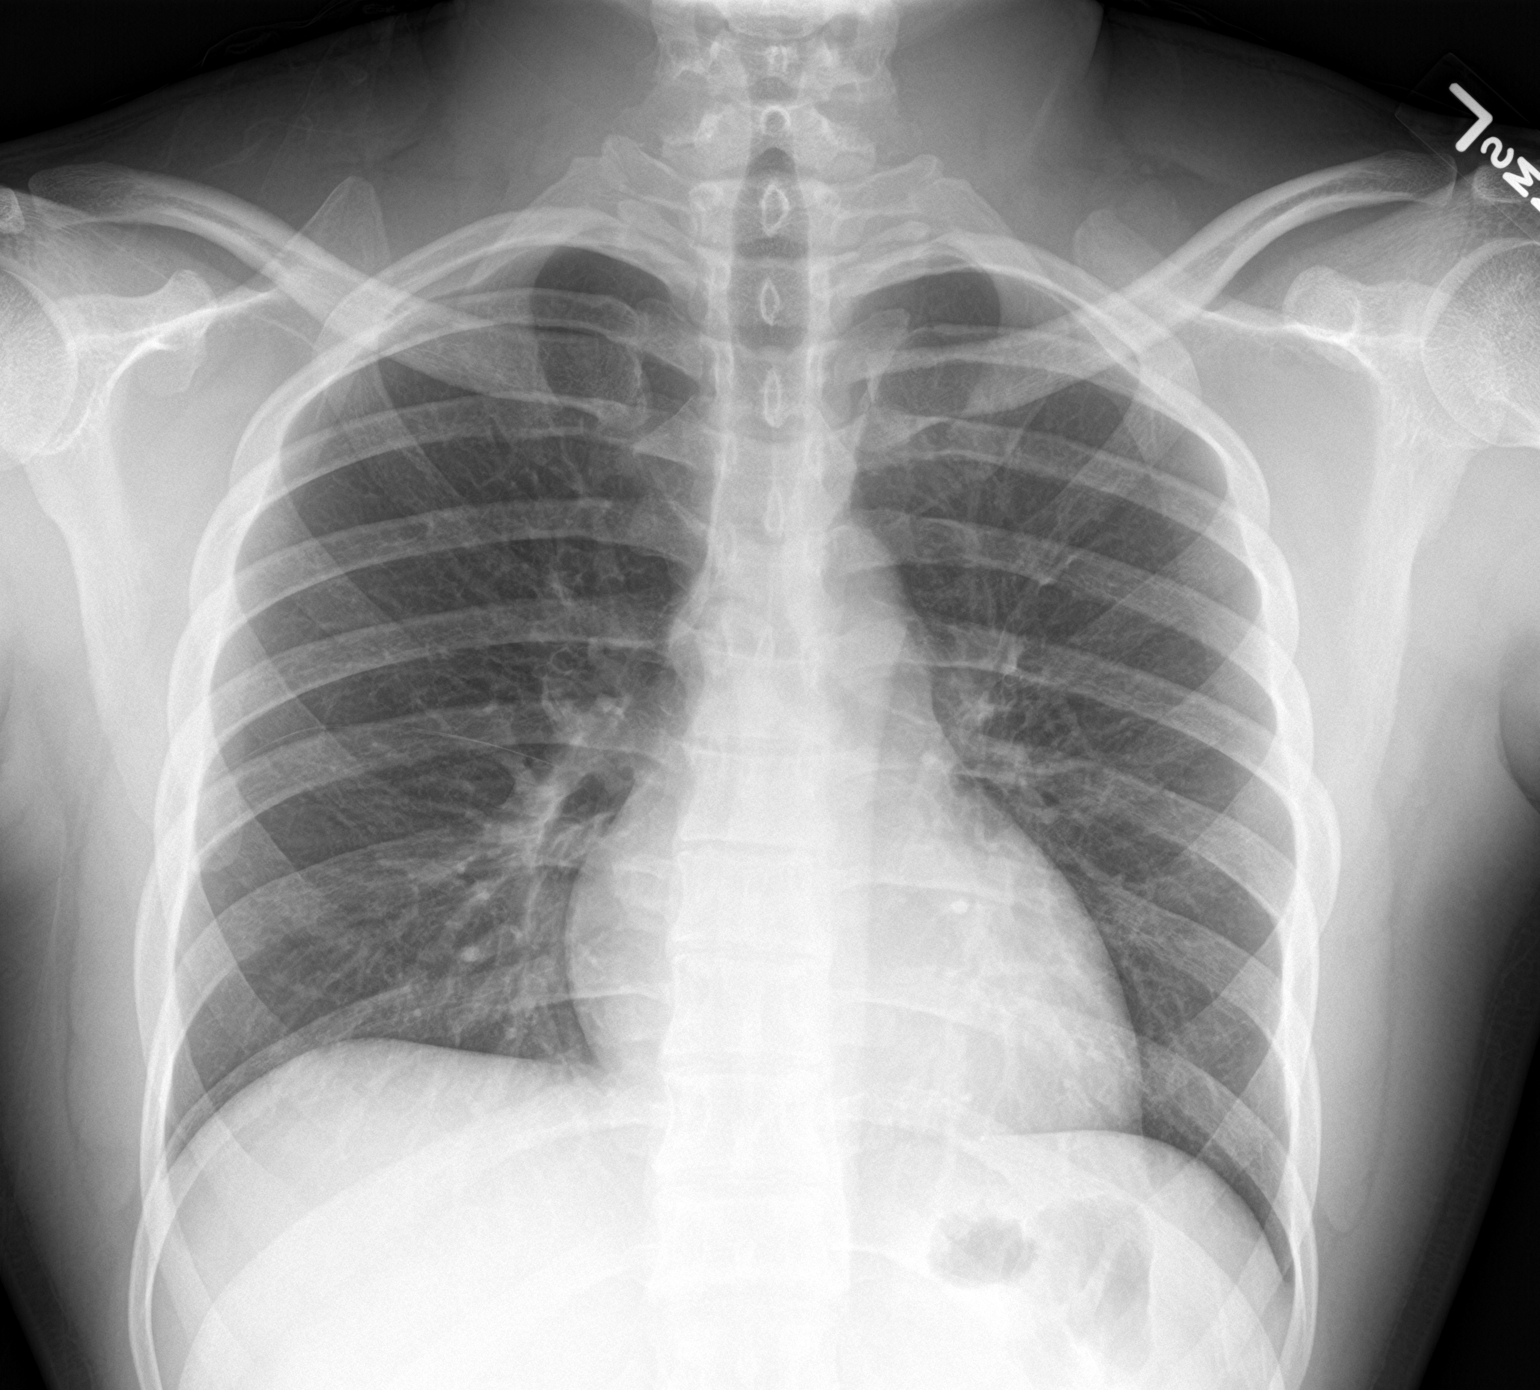

[chest lat]
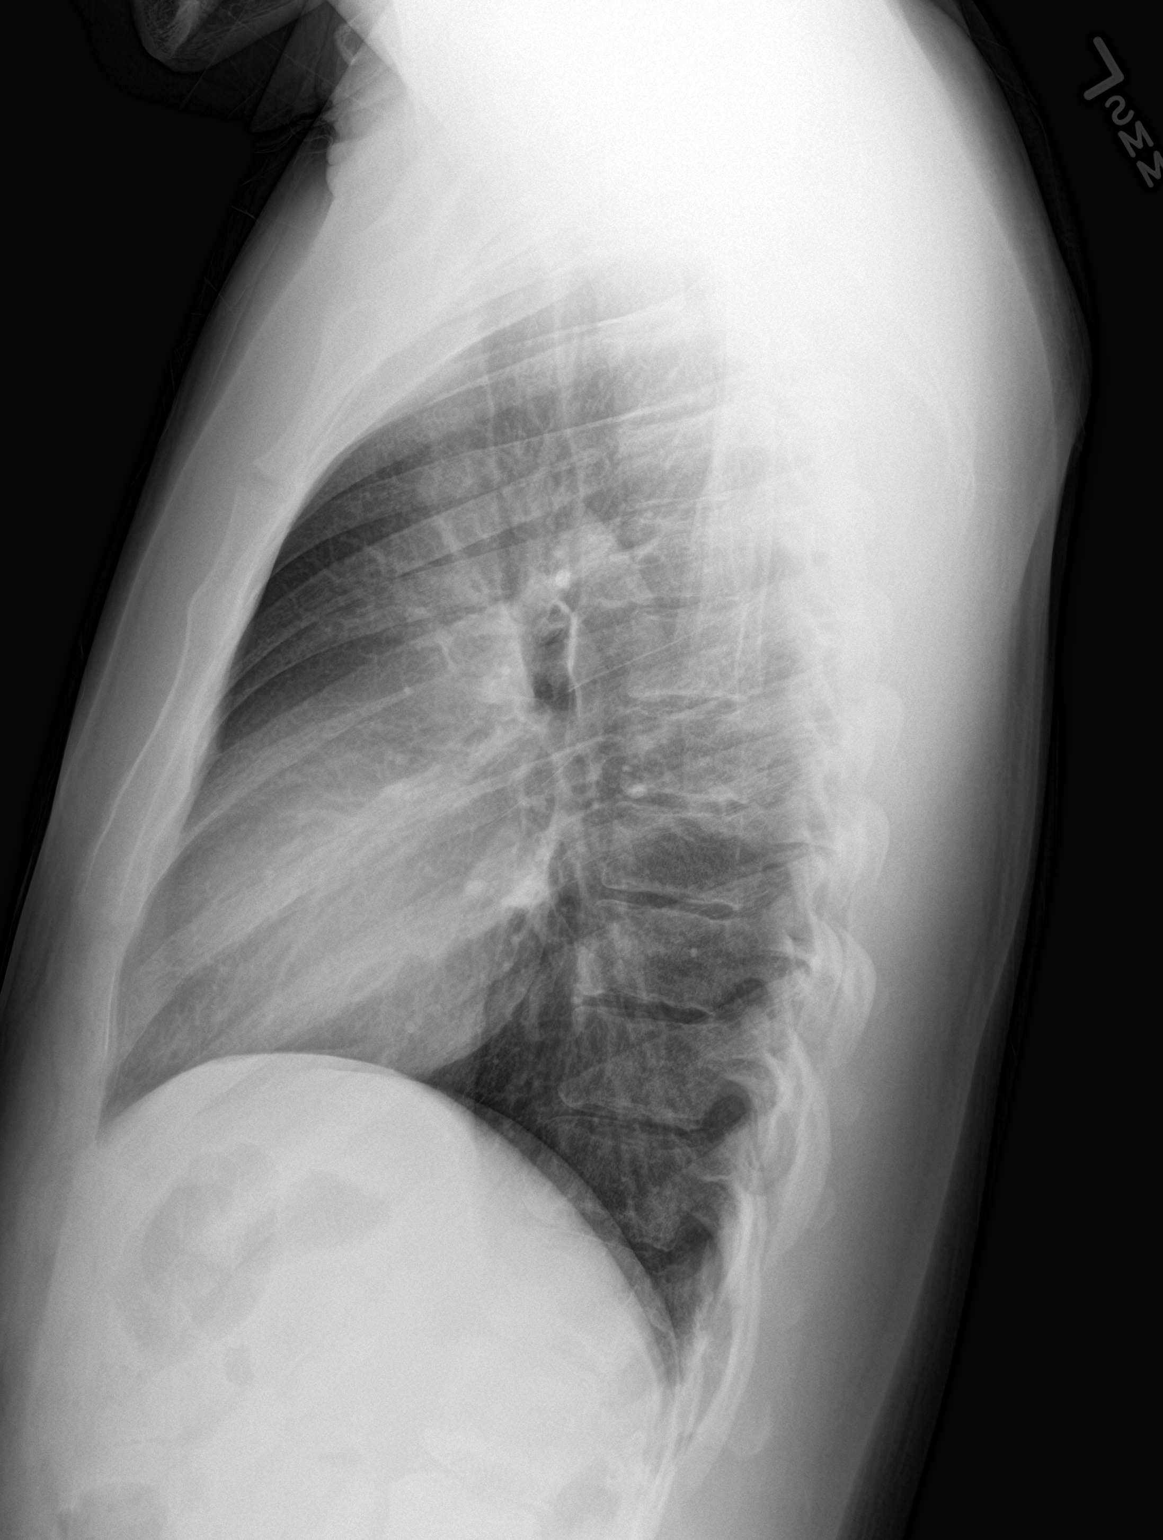

[2 of 2 positions shown; findings below may reference images not displayed]

FINDINGS: The heart size and mediastinal contours are within normal limits.
Both lungs are clear. The visualized skeletal structures are
unremarkable.
IMPRESSION: No acute abnormality of the lungs.

## 2021-11-12 ENCOUNTER — Emergency Department (HOSPITAL_COMMUNITY)
Admission: EM | Admit: 2021-11-12 | Discharge: 2021-11-12 | Disposition: A | Discharge status: Home / Self Care | Payer: Commercial Managed Care - HMO | Attending: Emergency Medicine | Admitting: Emergency Medicine

## 2021-11-12 ENCOUNTER — Encounter (HOSPITAL_COMMUNITY): Payer: Self-pay

## 2021-11-12 ENCOUNTER — Other Ambulatory Visit: Payer: Self-pay

## 2021-11-12 ENCOUNTER — Emergency Department (HOSPITAL_COMMUNITY): Payer: Commercial Managed Care - HMO

## 2021-11-12 DIAGNOSIS — Y990 Civilian activity done for income or pay: Secondary | ICD-10-CM | POA: Insufficient documentation

## 2021-11-12 DIAGNOSIS — X500XXA Overexertion from strenuous movement or load, initial encounter: Secondary | ICD-10-CM | POA: Insufficient documentation

## 2021-11-12 DIAGNOSIS — M79631 Pain in right forearm: Secondary | ICD-10-CM

## 2021-11-12 MED ORDER — IBUPROFEN 600 MG PO TABS
600.0000 mg | ORAL_TABLET | Freq: Four times a day (QID) | ORAL | 0 refills | Status: DC | PRN
Start: 2021-11-12 — End: 2022-03-01

## 2021-11-12 NOTE — ED Triage Notes (Signed)
Pt. Arrives POV c/o R. Forearm pain. Pt. Works for [employer] and states that he does a lot of lifting. He's been having pain for "a couple of weeks." But says it has been worse tonight.

## 2021-11-12 NOTE — ED Provider Notes (Signed)
[facility]-EMERGENCY DEPT [facility] Emergency Department Provider Note MRN:  [id number]  Arrival date & time: 11/12/21     Chief Complaint   Extremity Pain   History of Present Illness   Jimmy Powers is a 26 y.o. year-old male presents to the ED with chief complaint of right forearm pain for the past couple of weeks.  He states he works at [employer] and does a lot of heavy lifting.  He states that his symptoms worsened tonight.  Denies any specific injury.  States that his symptoms are worsened with wrist flexion and extension.  He denies numbness or tingling into his hand or fingers.  Denies any successful treatments.  History provided by patient.   Review of Systems  Pertinent positive and negative review of systems noted in HPI.    Physical Exam   Vitals:   11/12/21 0121  BP: 130/85  Pulse: 80  Resp: 17  Temp: (!) 97.5 F (36.4 C)  SpO2: 99%    CONSTITUTIONAL:  well-appearing, NAD NEURO:  Alert and oriented x 3, CN 3-12 grossly intact EYES:  eyes equal and reactive ENT/NECK:  Supple, no stridor  CARDIO:  appears well-perfused, normal cap refill, normal distal pulses PULM:  No respiratory distress,  GI/GU:  non-distended,  MSK/SPINE:  No gross deformities, no edema, moves all extremities  SKIN:  no rash, atraumatic   *Additional and/or pertinent findings included in MDM below  Diagnostic and Interventional Summary    EKG Interpretation  Date/Time:    Ventricular Rate:    PR Interval:    QRS Duration:   QT Interval:    QTC Calculation:   R Axis:     Text Interpretation:         Labs Reviewed - No data to display  DG Forearm Right  Final Result      Medications - No data to display   Procedures  /  Critical Care Procedures  ED Course and Medical Decision Making  I have reviewed the triage vital signs, the nursing notes, and pertinent available records from the EMR.  Social Determinants Affecting Complexity of Care: Patient has no clinically  significant social determinants affecting this chief complaint..   ED Course:    Medical Decision Making Patient here with right forearm pain.  No specific injury.  Works for [employer] and does a lot of lifting.  Concern for overuse injury.  Obtain plain films which were negative for any bony pathology.  We will have patient follow-up with orthopedics.  Amount and/or Complexity of Data Reviewed Radiology: ordered and independent interpretation performed.    Details: no fx  Risk Prescription drug management.     Consultants: No consultations were needed in caring for this patient.   Treatment and Plan: Emergency department workup does not suggest an emergent condition requiring admission or immediate intervention beyond  what has been performed at this time. The patient is safe for discharge and has  been instructed to return immediately for worsening symptoms, change in  symptoms or any other concerns    Final Clinical Impressions(s) / ED Diagnoses     ICD-10-CM   1. Pain of right forearm  M79.631       ED Discharge Orders          Ordered    ibuprofen (ADVIL) 600 MG tablet  Every 6 hours PRN        11/12/21 0254              Discharge Instructions Discussed  with and Provided to Patient:   Discharge Instructions   None      Roxy Horseman, PA-C 11/12/21 0256    Sloan Leiter, DO 11/21/21 [phone]

## 2021-12-11 ENCOUNTER — Emergency Department (HOSPITAL_COMMUNITY)
Admission: EM | Admit: 2021-12-11 | Discharge: 2021-12-11 | Disposition: A | Discharge status: Home / Self Care | Payer: Commercial Managed Care - HMO | Attending: Emergency Medicine | Admitting: Emergency Medicine

## 2021-12-11 ENCOUNTER — Encounter (HOSPITAL_COMMUNITY): Payer: Self-pay

## 2021-12-11 ENCOUNTER — Other Ambulatory Visit: Payer: Self-pay

## 2021-12-11 DIAGNOSIS — R109 Unspecified abdominal pain: Secondary | ICD-10-CM | POA: Diagnosis not present

## 2021-12-11 DIAGNOSIS — R197 Diarrhea, unspecified: Secondary | ICD-10-CM | POA: Diagnosis present

## 2021-12-11 DIAGNOSIS — R110 Nausea: Secondary | ICD-10-CM | POA: Insufficient documentation

## 2021-12-11 LAB — COMPREHENSIVE METABOLIC PANEL
ALT: 20 U/L (ref 0–44)
AST: 20 U/L (ref 15–41)
Albumin: 4.1 g/dL (ref 3.5–5.0)
Alkaline Phosphatase: 87 U/L (ref 38–126)
Anion gap: 8 (ref 5–15)
BUN: 11 mg/dL (ref 6–20)
CO2: 26 mmol/L (ref 22–32)
Calcium: 9.1 mg/dL (ref 8.9–10.3)
Chloride: 108 mmol/L (ref 98–111)
Creatinine, Ser: 0.95 mg/dL (ref 0.61–1.24)
GFR, Estimated: >60 mL/min (ref >60)
Glucose, Bld: 97 mg/dL (ref 70–99)
Potassium: 3.3 mmol/L — ABNORMAL LOW (ref 3.5–5.1)
Sodium: 142 mmol/L (ref 135–145)
Total Bilirubin: 0.9 mg/dL (ref 0.3–1.2)
Total Protein: 7.7 g/dL (ref 6.5–8.1)

## 2021-12-11 LAB — URINALYSIS, ROUTINE W REFLEX MICROSCOPIC
Bilirubin Urine: NEGATIVE
Glucose, UA: NEGATIVE mg/dL
Hgb urine dipstick: NEGATIVE
Ketones, ur: NEGATIVE mg/dL
Leukocytes,Ua: NEGATIVE
Nitrite: NEGATIVE
Protein, ur: NEGATIVE mg/dL
Specific Gravity, Urine: 1.021 (ref 1.005–1.030)
pH: 5 (ref 5.0–8.0)

## 2021-12-11 LAB — CBC WITH DIFFERENTIAL/PLATELET
Abs Immature Granulocytes: 0.01 10*3/uL (ref 0.00–0.07)
Basophils Absolute: 0 10*3/uL (ref 0.0–0.1)
Basophils Relative: 1 %
Eosinophils Absolute: 0.5 10*3/uL (ref 0.0–0.5)
Eosinophils Relative: 12 %
HCT: 40.6 % (ref 39.0–52.0)
Hemoglobin: 13.3 g/dL (ref 13.0–17.0)
Immature Granulocytes: 0 %
Lymphocytes Relative: 17 %
Lymphs Abs: 0.7 10*3/uL (ref 0.7–4.0)
MCH: 24.9 pg — ABNORMAL LOW (ref 26.0–34.0)
MCHC: 32.8 g/dL (ref 30.0–36.0)
MCV: 76 fL — ABNORMAL LOW (ref 80.0–100.0)
Monocytes Absolute: 0.6 10*3/uL (ref 0.1–1.0)
Monocytes Relative: 14 %
Neutro Abs: 2.2 10*3/uL (ref 1.7–7.7)
Neutrophils Relative %: 56 %
Platelets: 197 10*3/uL (ref 150–400)
RBC: 5.34 MIL/uL (ref 4.22–5.81)
RDW: 14.1 % (ref 11.5–15.5)
WBC: 4 10*3/uL (ref 4.0–10.5)
nRBC: 0 % (ref 0.0–0.2)

## 2021-12-11 LAB — LIPASE, BLOOD: Lipase: 44 U/L (ref 11–51)

## 2021-12-11 MED ORDER — DICYCLOMINE HCL 20 MG PO TABS
20.0000 mg | ORAL_TABLET | Freq: Two times a day (BID) | ORAL | 0 refills | Status: DC | PRN
Start: 2021-12-11 — End: 2022-03-01

## 2021-12-11 NOTE — ED Provider Triage Note (Signed)
Emergency Medicine Provider Triage Evaluation Note  Jimmy Powers , a 26 y.o. male  was evaluated in triage.  Pt complains of abdominal pain since Sunday.  He reported eating fast food that he believes was bad.  He has had abdominal pain and multiple episodes of diarrhea per day since then.  Reports the diarrhea is watery, nonbloody.  Denies nausea or vomiting.  Patient states he has not eaten in the last 24 hours and has only had 3 episodes today.  Review of Systems  Positive: Diarrhea, abdominal pain Negative: Nausea, vomiting  Physical Exam  BP 121/75 (BP Location: Left Arm)   Pulse 92   Temp 99.4 F (37.4 C) (Oral)   Resp 16   SpO2 96%  Gen:   Awake, no distress   Resp:  Normal effort  MSK:   Moves extremities without difficulty  Other:  Abdomen mildly tender to palpation in the left upper and left lower quadrants  Medical Decision Making  Medically screening exam initiated at 4:07 PM.  Appropriate orders placed.  Jimmy Powers was informed that the remainder of the evaluation will be completed by another provider, this initial triage assessment does not replace that evaluation, and the importance of remaining in the ED until their evaluation is complete.     Michelle Piper, [place] 12/11/21 1608

## 2021-12-11 NOTE — ED Triage Notes (Signed)
Pt c/o diarrhea since yesterday. Pt denies N/V. Pt denies abdominal pain.

## 2021-12-11 NOTE — ED Provider Notes (Signed)
[facility]-EMERGENCY DEPT Provider Note   CSN: [id number] Arrival date & time: 12/11/21  1441     History  Chief Complaint  Patient presents with   Diarrhea    Jimmy Powers is a 26 y.o. male.  Patient is a 26 year old healthy male presenting today with a 2-day history of abdominal cramps and diarrhea.  Patient reported the symptoms started yesterday when he woke up.  He had approximately 5-6 episodes of diarrhea yesterday with intermittent abdominal cramping associated with the diarrhea as well as severe nausea without vomiting.  He reports today has been a little bit better but he has had 3 episodes of diarrhea and then took some Imodium.  His abdomen was cramping earlier on the left side but that has seemed to improve.  He denies any urinary symptoms.  He reports prior to the symptoms starting he had eaten a lot of fast food.  Nobody else ate the same food that he did and nobody else in his home is ill.  He denies any blood in his stool or fever.  He has had no recent travel.  He denies a history of chronic bowel problems.  The history is provided by the patient.  Diarrhea      Home Medications Prior to Admission medications   Medication Sig Start Date End Date Taking? Authorizing Provider  dicyclomine (BENTYL) 20 MG tablet Take 1 tablet (20 mg total) by mouth 2 (two) times daily as needed for spasms (bowel cramps). 12/11/21  Yes Gwyneth Sprout, MD  cyclobenzaprine (FLEXERIL) 10 MG tablet Take 1 tablet (10 mg total) by mouth 2 (two) times daily as needed for muscle spasms. Patient not taking: Reported on 11/12/2021 08/21/21   Carroll Sage, PA-C  fluticasone [facility]) 50 MCG/ACT nasal spray Place 2 sprays into both nostrils daily. Patient not taking: Reported on 11/12/2021 08/13/20   Domenick Gong, MD  ibuprofen (ADVIL) 600 MG tablet Take 1 tablet (600 mg total) by mouth every 6 (six) hours as needed. 11/12/21   Roxy Horseman, PA-C  loperamide  (IMODIUM) 2 MG capsule Take 1 capsule (2 mg total) by mouth 4 (four) times daily as needed for diarrhea or loose stools. Patient not taking: Reported on 11/12/2021 06/11/21   Renne Crigler, PA-C  moxifloxacin (VIGAMOX) 0.5 % ophthalmic solution Place 1 drop into the left eye in the morning, at noon, in the evening, and at bedtime. Patient not taking: Reported on 11/12/2021 02/02/21   Virgina Norfolk, DO  predniSONE (STERAPRED UNI-PAK 21 TAB) 10 MG (21) TBPK tablet Take 6 tabs by mouth daily  for 2 days, then 5 tabs for 2 days, then 4 tabs for 2 days, then 3 tabs for 2 days, 2 tabs for 2 days, then 1 tab by mouth daily for 2 days Patient not taking: Reported on 11/12/2021 07/23/21   Loeffler, Finis Bud, PA-C  cetirizine (ZYRTEC ALLERGY) 10 MG tablet Take 1 tablet (10 mg total) by mouth daily. 06/08/20 07/20/20  Wallis Bamberg, PA-C      Allergies    Amoxicillin    Review of Systems   Review of Systems  Gastrointestinal:  Positive for diarrhea.    Physical Exam Updated Vital Signs BP 131/88   Pulse 90   Temp 98.4 F (36.9 C) (Oral)   Resp 17   SpO2 100%  Physical Exam Vitals and nursing note reviewed.  Constitutional:      General: He is not in acute distress.    Appearance: He is well-developed.  HENT:     Head: Normocephalic and atraumatic.  Eyes:     Conjunctiva/sclera: Conjunctivae normal.     Pupils: Pupils are equal, round, and reactive to light.  Cardiovascular:     Rate and Rhythm: Normal rate and regular rhythm.     Heart sounds: No murmur heard. Pulmonary:     Effort: Pulmonary effort is normal. No respiratory distress.     Breath sounds: Normal breath sounds. No wheezing or rales.  Abdominal:     General: There is no distension.     Palpations: Abdomen is soft.     Tenderness: There is no abdominal tenderness. There is no right CVA tenderness, left CVA tenderness, guarding or rebound.     Comments: No significant abdominal pain at this time  Musculoskeletal:        General:  No tenderness. Normal range of motion.     Cervical back: Normal range of motion and neck supple.  Skin:    General: Skin is warm and dry.     Findings: No erythema or rash.  Neurological:     Mental Status: He is alert and oriented to person, place, and time.  Psychiatric:        Behavior: Behavior normal.     ED Results / Procedures / Treatments   Labs (all labs ordered are listed, but only abnormal results are displayed) Labs Reviewed  CBC WITH DIFFERENTIAL/PLATELET - Abnormal; Notable for the following components:      Result Value   MCV 76.0 (*)    MCH 24.9 (*)    All other components within normal limits  COMPREHENSIVE METABOLIC PANEL - Abnormal; Notable for the following components:   Potassium 3.3 (*)    All other components within normal limits  LIPASE, BLOOD  URINALYSIS, ROUTINE W REFLEX MICROSCOPIC    EKG None  Radiology No results found.  Procedures Procedures    Medications Ordered in ED Medications - No data to display  ED Course/ Medical Decision Making/ A&P                           Medical Decision Making Amount and/or Complexity of Data Reviewed Labs: ordered. Decision-making details documented in ED Course.  Risk Prescription drug management.   Well-appearing 26 year old patient presenting here today with a 2-day history of diarrhea.  Patient has not no reproducible abdominal pain on exam.  He has no risk factors for C. difficile and has not had any recent travel.  Suspect foodborne illness versus viral etiology.  Patient is not having any vomiting and reported nausea yesterday but none today.  I independently interpreted patient's labs today and CBC is reassuring, CMP with minimal hypokalemia of 3.3 most likely from the diarrhea but otherwise normal, lipase within normal limits.  Low suspicion at this time for diverticulitis, pancreatitis or hepatitis.  Patient has no urinary symptoms and low suspicion for renal stone or pyelonephritis.   Instructed patient to continue hydration, brat diet and Imodium as needed.  Patient given a prescription for Bentyl to use as needed.  He was discharged home in good condition.        Final Clinical Impression(s) / ED Diagnoses Final diagnoses:  Diarrhea of presumed infectious origin    Rx / DC Orders ED Discharge Orders          Ordered    dicyclomine (BENTYL) 20 MG tablet  2 times daily PRN  12/11/21 2018              Gwyneth Sprout, MD 12/11/21 2019

## 2021-12-11 NOTE — Discharge Instructions (Signed)
You can continue to use the Imodium as needed for diarrhea.  You can also use the Bentyl as needed for abdominal cramps

## 2021-12-24 ENCOUNTER — Encounter (HOSPITAL_COMMUNITY): Payer: Self-pay | Admitting: Emergency Medicine

## 2021-12-24 ENCOUNTER — Ambulatory Visit (HOSPITAL_COMMUNITY)
Admission: EM | Admit: 2021-12-24 | Discharge: 2021-12-24 | Disposition: A | Discharge status: Home / Self Care | Payer: Commercial Managed Care - HMO | Attending: Physician Assistant | Admitting: Physician Assistant

## 2021-12-24 DIAGNOSIS — H1031 Unspecified acute conjunctivitis, right eye: Secondary | ICD-10-CM

## 2021-12-24 MED ORDER — POLYMYXIN B-TRIMETHOPRIM 10000-0.1 UNIT/ML-% OP SOLN: 1.0000 [drp] | Freq: Four times a day (QID) | OPHTHALMIC | 0 refills | Status: DC

## 2021-12-24 NOTE — ED Triage Notes (Signed)
Pt reports since Saturday had right eye redness, irritation and blurred with watering. Denies pain

## 2021-12-24 NOTE — ED Provider Notes (Signed)
[facility]    CSN: [id number] Arrival date & time: 12/24/21  [id number]      History   Chief Complaint Chief Complaint  Patient presents with   Conjunctivitis    HPI Jimmy Powers is a 26 y.o. male.   26 year old male presents with right eye redness.  Patient indicates since Saturday morning he has been having some bright redness, irritation, increased watering.  Patient indicates that his eye was matted on Saturday morning when he woke up and he has had to clear the matter out.  He also indicates that he has had some photophobia with sunlight to irritating his eye.  He also indicates that he has had some intermittent blurred vision and mild pain in the right eye.  Patient denies any trauma to the right eye, no fever or chills, no recent upper respiratory infection.  He denies being exposed to any friends or family with similar type symptoms.  He does relate that he has had pinkeye before and he was using some of the drops that he has treated it in the past.  He indicates he used this on Saturday and Sunday and it has decreased his pain, drainage, and relates that it has improved.  He denies any trauma to the eye.   Conjunctivitis    Past Medical History:  Diagnosis Date   Pneumonia     There are no problems to display for this patient.   Past Surgical History:  Procedure Laterality Date   APPENDECTOMY         Home Medications    Prior to Admission medications   Medication Sig Start Date End Date Taking? Authorizing Provider  trimethoprim-polymyxin b (POLYTRIM) ophthalmic solution Place 1 drop into both eyes every 6 (six) hours. 12/24/21  Yes Nyoka Lint, PA-C  cyclobenzaprine (FLEXERIL) 10 MG tablet Take 1 tablet (10 mg total) by mouth 2 (two) times Powers as needed for muscle spasms. Patient not taking: Reported on 11/12/2021 08/21/21   Marcello Fennel, PA-C  dicyclomine (BENTYL) 20 MG tablet Take 1 tablet (20 mg total) by mouth 2 (two) times Powers as needed  for spasms (bowel cramps). 12/11/21   Blanchie Dessert, MD  fluticasone (FLONASE) 50 MCG/ACT nasal spray Place 2 sprays into both nostrils Powers. Patient not taking: Reported on 11/12/2021 08/13/20   Melynda Ripple, MD  ibuprofen (ADVIL) 600 MG tablet Take 1 tablet (600 mg total) by mouth every 6 (six) hours as needed. 11/12/21   Montine Circle, PA-C  loperamide (IMODIUM) 2 MG capsule Take 1 capsule (2 mg total) by mouth 4 (four) times Powers as needed for diarrhea or loose stools. Patient not taking: Reported on 11/12/2021 06/11/21   Carlisle Cater, PA-C  moxifloxacin (VIGAMOX) 0.5 % ophthalmic solution Place 1 drop into the left eye in the morning, at noon, in the evening, and at bedtime. Patient not taking: Reported on 11/12/2021 02/02/21   Lennice Sites, DO  predniSONE (STERAPRED UNI-PAK 21 TAB) 10 MG (21) TBPK tablet Take 6 tabs by mouth Powers  for 2 days, then 5 tabs for 2 days, then 4 tabs for 2 days, then 3 tabs for 2 days, 2 tabs for 2 days, then 1 tab by mouth Powers for 2 days Patient not taking: Reported on 11/12/2021 07/23/21   Loeffler, Adora Fridge, PA-C  cetirizine (ZYRTEC ALLERGY) 10 MG tablet Take 1 tablet (10 mg total) by mouth Powers. 06/08/20 07/20/20  Jaynee Eagles, PA-C    Family History Family History  Problem Relation Age  of Onset   Hypertension Mother     Social History Social History   Tobacco Use   Smoking status: Never   Smokeless tobacco: Never  Substance Use Topics   Alcohol use: No   Drug use: No     Allergies   Amoxicillin   Review of Systems Review of Systems  Eyes:  Positive for discharge (right eye redness with matting).     Physical Exam Triage Vital Signs ED Triage Vitals  Enc Vitals Group     BP 12/24/21 0846 130/78     Pulse Rate 12/24/21 0846 76     Resp 12/24/21 0846 16     Temp 12/24/21 0846 98.4 F (36.9 C)     Temp src --      SpO2 12/24/21 0846 96 %     Weight --      Height --      Head Circumference --      Peak Flow --       Pain Score 12/24/21 0845 0     Pain Loc --      Pain Edu? --      Excl. in [place]? --    No data found.  Updated Vital Signs BP 130/78 (BP Location: Left Arm)   Pulse 76   Temp 98.4 F (36.9 C)   Resp 16   SpO2 96%   Visual Acuity Right Eye Distance: patient can't read sign, states that supposed to wear glasses doesnt have with him Left Eye Distance: 20/200 Bilateral Distance: 20/200  Right Eye Near:   Left Eye Near:    Bilateral Near:     Physical Exam Constitutional:      Appearance: Normal appearance.  HENT:     Right Ear: Tympanic membrane and ear canal normal.     Left Ear: Tympanic membrane and ear canal normal.     Mouth/Throat:     Mouth: Mucous membranes are moist.     Pharynx: Oropharynx is clear.  Eyes:     General: Lids are normal. Lids are everted, no foreign bodies appreciated.     Extraocular Movements: Extraocular movements intact.     Comments: Eyes: PERRLA. Right eye: Conjunctiva with moderate redness present, no active drainage noted, no unusual swelling of the upper or lower lids.  Neurological:     Mental Status: He is alert.      UC Treatments / Results  Labs (all labs ordered are listed, but only abnormal results are displayed) Labs Reviewed - No data to display  EKG   Radiology No results found.  Procedures Procedures (including critical care time)  Medications Ordered in UC Medications - No data to display  Initial Impression / Assessment and Plan / UC Course  I have reviewed the triage vital signs and the nursing notes.  Pertinent labs & imaging results that were available during my care of the patient were reviewed by me and considered in my medical decision making (see chart for details).       Plan: 1.  Acute uncomplicated: A.  Polytrim ophthalmic solution, every 6 hours to treat the conjunctivitis of the right eye. B.  Advised to follow-up with PCP or return to urgent care if symptoms fail to improve. Final Clinical  Impressions(s) / UC Diagnoses   Final diagnoses:  Acute bacterial conjunctivitis of right eye     Discharge Instructions      Advised to wear sunglasses to help prevent irritation from sunlight to the eyes. Advised  to use the Polytrim eyedrops as directed every 6 hours on a regular basis until the redness clears. Advised to follow-up with PCP or return to urgent care if symptoms fail to improve.     ED Prescriptions     Medication Sig Dispense Auth. Provider   trimethoprim-polymyxin b (POLYTRIM) ophthalmic solution Place 1 drop into both eyes every 6 (six) hours. 10 mL Nyoka Lint, PA-C      PDMP not reviewed this encounter.   Nyoka Lint, PA-C 12/24/21 [phone]

## 2021-12-24 NOTE — Discharge Instructions (Signed)
Advised to wear sunglasses to help prevent irritation from sunlight to the eyes. Advised to use the Polytrim eyedrops as directed every 6 hours on a regular basis until the redness clears. Advised to follow-up with PCP or return to urgent care if symptoms fail to improve.

## 2021-12-27 ENCOUNTER — Encounter (HOSPITAL_COMMUNITY): Payer: Self-pay

## 2021-12-27 ENCOUNTER — Emergency Department (HOSPITAL_COMMUNITY)
Admission: EM | Admit: 2021-12-27 | Discharge: 2021-12-27 | Disposition: A | Discharge status: Home / Self Care | Payer: Commercial Managed Care - HMO | Attending: Emergency Medicine | Admitting: Emergency Medicine

## 2021-12-27 ENCOUNTER — Other Ambulatory Visit: Payer: Self-pay

## 2021-12-27 DIAGNOSIS — H538 Other visual disturbances: Secondary | ICD-10-CM | POA: Diagnosis not present

## 2021-12-27 DIAGNOSIS — H539 Unspecified visual disturbance: Secondary | ICD-10-CM

## 2021-12-27 MED ORDER — FLUORESCEIN SODIUM 1 MG OP STRP
1.0000 | ORAL_STRIP | Freq: Once | OPHTHALMIC | Status: AC
  Administered 2021-12-27: 1 via OPHTHALMIC
  Filled 2021-12-27: qty 1

## 2021-12-27 MED ORDER — TETRACAINE HCL 0.5 % OP SOLN
1.0000 [drp] | Freq: Once | OPHTHALMIC | Status: AC
  Administered 2021-12-27: 1 [drp] via OPHTHALMIC
  Filled 2021-12-27: qty 4

## 2021-12-27 NOTE — ED Provider Notes (Signed)
[facility]-EMERGENCY DEPT Provider Note   CSN: [id number] Arrival date & time: 12/27/21  8657     History Chief Complaint  Patient presents with   Eye Problem    HPI Jimmy Powers is a 26 y.o. male presenting for chief complaint of visual loss.  He states that his right eye has had acute visual loss of his right eye over the last 4 days.  He endorses at baseline he is able to drive and has equal vision bilaterally.  However he states his right eye everything is very blurry cannot see the even his phone screen. He was seen over the weekend for irritation and redness and used Polytrim drops over the weekend.  No history of pupillary abnormality.  He states his eyes are normally round. Patient's recorded medical, surgical, social, medication list and allergies were reviewed in the Snapshot window as part of the initial history.   Review of Systems   Review of Systems  Constitutional:  Negative for chills and fever.  HENT:  Negative for ear pain and sore throat.   Eyes:  Positive for discharge, redness and visual disturbance. Negative for pain.  Respiratory:  Negative for cough and shortness of breath.   Cardiovascular:  Negative for chest pain and palpitations.  Gastrointestinal:  Negative for abdominal pain and vomiting.  Genitourinary:  Negative for dysuria and hematuria.  Musculoskeletal:  Negative for arthralgias and back pain.  Skin:  Negative for color change and rash.  Neurological:  Negative for seizures and syncope.  All other systems reviewed and are negative.   Physical Exam Updated Vital Signs BP 128/82 (BP Location: Right Arm)   Pulse 71   Temp 98.3 F (36.8 C) (Oral)   Resp 16   Ht 5' 10.5" (1.791 m)   Wt 78.9 kg   SpO2 100%   BMI 24.61 kg/m  Physical Exam Vitals and nursing note reviewed.  Constitutional:      General: He is not in acute distress.    Appearance: He is well-developed.  HENT:     Head: Normocephalic and atraumatic.   Eyes:     Comments: Pupils asymmetric bilaterally, not round with extension of pupils. Bilateral conjunctival irritation without gross injection IOP 15-95% on the right, 14-95% on the left. Visual acuity 20/300 right, 20/40 left. Complete slit-lamp exam without any fluorescein uptake, negative Seidel sign, no foreign bodies.  Negative for cell and flare.  Cardiovascular:     Rate and Rhythm: Normal rate and regular rhythm.     Heart sounds: No murmur heard. Pulmonary:     Effort: Pulmonary effort is normal. No respiratory distress.     Breath sounds: Normal breath sounds.  Abdominal:     Palpations: Abdomen is soft.     Tenderness: There is no abdominal tenderness.  Musculoskeletal:        General: No swelling.     Cervical back: Neck supple.  Skin:    General: Skin is warm and dry.     Capillary Refill: Capillary refill takes less than 2 seconds.  Neurological:     Mental Status: He is alert.  Psychiatric:        Mood and Affect: Mood normal.           ED Course/ Medical Decision Making/ A&P    Procedures Procedures   Medications Ordered in ED Medications  tetracaine (PONTOCAINE) 0.5 % ophthalmic solution 1-2 drop (1 drop Both Eyes Given 12/27/21 0820)  fluorescein ophthalmic strip 1 strip (  1 strip Both Eyes Given 12/27/21 0820)    Medical Decision Making:    Jimmy Powers is a 26 y.o. male who presented to the ED today with acute visual disturbance detailed above.    Patient placed on continuous vitals and telemetry monitoring while in ED which was reviewed periodically.    Complete initial physical exam performed, notably the patient  was hemodynamically stable in no acute distress.  Eye exam documented above.  Grossly concerning for right eye acute visual deficit with 20/300 compared to left eye 20/40.    Additionally, bilateral pupillary defects appreciated.  Extraocular motions intact, intraocular pressures within normal limits.  No foreign body under  slit-lamp, no cell and flare under the lamp.  No fluorescein uptake no reticular pattern. Reviewed and confirmed nursing documentation for past medical history, family history, social history.    Initial Assessment:   This is consistent with an acute potentially life or limb threatening illness. Patient's history of present on his physical exam findings are concerning for progressive ophthalmologic infection versus potential limbus rupture bilaterally. Given the progression of the right eye visual acuity loss, concern for ophthalmologic emergency. Considered HSV orbital infection, CVA, traumatic injury.  However history appears different than the above. Emergently consulted on-call ophthalmology Dr. Valetta Close.  There case coordinator recommended patient immediately present at their facility where there are more ophthalmologic tools and they have an immediate appointment slot for the patient.  Address written down for the patient, confirmed with staff.  Patient to be transferred by his brother who is in the lobby at this time directly to their facility.  Patient expressed understanding of the importance, risk of progressive infection, loss of function of the IV is not present for further care.  He was instructed to return if he is unable to be seen by them for unexpected reasons.   Clinical Impression:  1. Visual disturbance      Discharge   Final Clinical Impression(s) / ED Diagnoses Final diagnoses:  Visual disturbance    Rx / DC Orders ED Discharge Orders     None         Tretha Sciara, MD 12/27/21 [phone]

## 2021-12-27 NOTE — Discharge Instructions (Addendum)
[email or web address] [place], [place] [address]  ~1.9 mi  Please proceed straight there. They are expecting you soon.

## 2021-12-27 NOTE — ED Triage Notes (Signed)
Patient was seen at urgent care of Monday for eye infection. Patient states after taking the mediated eye drops, his vision is blurry and he cannot see out of the infected eye.

## 2022-01-29 ENCOUNTER — Ambulatory Visit (HOSPITAL_COMMUNITY)
Admission: EM | Admit: 2022-01-29 | Discharge: 2022-01-29 | Disposition: A | Discharge status: Home / Self Care | Payer: Commercial Managed Care - HMO | Attending: Family Medicine | Admitting: Family Medicine

## 2022-01-29 ENCOUNTER — Encounter (HOSPITAL_COMMUNITY): Payer: Self-pay | Admitting: Emergency Medicine

## 2022-01-29 DIAGNOSIS — M545 Low back pain, unspecified: Secondary | ICD-10-CM

## 2022-01-29 MED ORDER — KETOROLAC TROMETHAMINE 30 MG/ML IJ SOLN
INTRAMUSCULAR | Status: AC
  Filled 2022-01-29: qty 1

## 2022-01-29 MED ORDER — KETOROLAC TROMETHAMINE 30 MG/ML IJ SOLN
30.0000 mg | Freq: Once | INTRAMUSCULAR | Status: AC
  Administered 2022-01-29: 30 mg via INTRAMUSCULAR

## 2022-01-29 MED ORDER — CYCLOBENZAPRINE HCL 10 MG PO TABS: 10.0000 mg | ORAL_TABLET | Freq: Two times a day (BID) | ORAL | 0 refills | Status: DC | PRN

## 2022-01-29 NOTE — Discharge Instructions (Addendum)
You were seen today for back pain.  I have given you a shot of toradol today for pain.  I have sent out a muscle relaxer to take when home and not driving as this can make you sleepy.  I recommend motrin, as well as heat for pain.  Please return or follow up with your primary care provider if not improving as expected.

## 2022-01-29 NOTE — ED Provider Notes (Signed)
[facility]    CSN: [id number] Arrival date & time: 01/29/22  1332      History   Chief Complaint Chief Complaint  Patient presents with   Back Pain    HPI Jimmy Powers is a 26 y.o. male.   Patient is here for left lower back pain after doing some heavy lifting yesterday . The pain stops right at the buttocks, does not go down the leg.  About 7/10.  He did took motrin this morning without much help.         Past Medical History:  Diagnosis Date   Pneumonia     There are no problems to display for this patient.   Past Surgical History:  Procedure Laterality Date   APPENDECTOMY         Home Medications    Prior to Admission medications   Medication Sig Start Date End Date Taking? Authorizing Provider  cyclobenzaprine (FLEXERIL) 10 MG tablet Take 1 tablet (10 mg total) by mouth 2 (two) times daily as needed for muscle spasms. Patient not taking: Reported on 11/12/2021 08/21/21   Marcello Fennel, PA-C  dicyclomine (BENTYL) 20 MG tablet Take 1 tablet (20 mg total) by mouth 2 (two) times daily as needed for spasms (bowel cramps). 12/11/21   Blanchie Dessert, MD  fluticasone (FLONASE) 50 MCG/ACT nasal spray Place 2 sprays into both nostrils daily. Patient not taking: Reported on 11/12/2021 08/13/20   Melynda Ripple, MD  ibuprofen (ADVIL) 600 MG tablet Take 1 tablet (600 mg total) by mouth every 6 (six) hours as needed. 11/12/21   Montine Circle, PA-C  loperamide (IMODIUM) 2 MG capsule Take 1 capsule (2 mg total) by mouth 4 (four) times daily as needed for diarrhea or loose stools. Patient not taking: Reported on 11/12/2021 06/11/21   Carlisle Cater, PA-C  moxifloxacin (VIGAMOX) 0.5 % ophthalmic solution Place 1 drop into the left eye in the morning, at noon, in the evening, and at bedtime. Patient not taking: Reported on 11/12/2021 02/02/21   Lennice Sites, DO  predniSONE (STERAPRED UNI-PAK 21 TAB) 10 MG (21) TBPK tablet Take 6 tabs by mouth daily  for  2 days, then 5 tabs for 2 days, then 4 tabs for 2 days, then 3 tabs for 2 days, 2 tabs for 2 days, then 1 tab by mouth daily for 2 days Patient not taking: Reported on 11/12/2021 07/23/21   Loeffler, Adora Fridge, PA-C  trimethoprim-polymyxin b (POLYTRIM) ophthalmic solution Place 1 drop into both eyes every 6 (six) hours. 12/24/21   Nyoka Lint, PA-C  cetirizine (ZYRTEC ALLERGY) 10 MG tablet Take 1 tablet (10 mg total) by mouth daily. 06/08/20 07/20/20  Jaynee Eagles, PA-C    Family History Family History  Problem Relation Age of Onset   Hypertension Mother     Social History Social History   Tobacco Use   Smoking status: Never   Smokeless tobacco: Never  Substance Use Topics   Alcohol use: No   Drug use: No     Allergies   Amoxicillin   Review of Systems Review of Systems  Constitutional: Negative.   HENT: Negative.    Respiratory: Negative.    Gastrointestinal: Negative.   Genitourinary: Negative.   Musculoskeletal:  Positive for back pain.     Physical Exam Triage Vital Signs ED Triage Vitals  Enc Vitals Group     BP 01/29/22 1455 132/80     Pulse Rate 01/29/22 1455 82     Resp 01/29/22 1455  17     Temp 01/29/22 1455 97.9 F (36.6 C)     Temp Source 01/29/22 1455 Oral     SpO2 01/29/22 1455 97 %     Weight --      Height --      Head Circumference --      Peak Flow --      Pain Score 01/29/22 1456 7     Pain Loc --      Pain Edu? --      Excl. in [place]? --    No data found.  Updated Vital Signs BP 132/80 (BP Location: Right Arm)   Pulse 82   Temp 97.9 F (36.6 C) (Oral)   Resp 17   SpO2 97%   Visual Acuity Right Eye Distance:   Left Eye Distance:   Bilateral Distance:    Right Eye Near:   Left Eye Near:    Bilateral Near:     Physical Exam Constitutional:      Appearance: Normal appearance.  Musculoskeletal:     Comments: No TTP to the lumbar spine;  + TTP to the lower left back into the left buttocks;  normal strength at the LE bilaterally;   full rom;  straight leg raise negative  Skin:    General: Skin is warm.  Neurological:     General: No focal deficit present.     Mental Status: He is alert.     Motor: No weakness.  Psychiatric:        Mood and Affect: Mood normal.      UC Treatments / Results  Labs (all labs ordered are listed, but only abnormal results are displayed) Labs Reviewed - No data to display  EKG   Radiology No results found.  Procedures Procedures (including critical care time)  Medications Ordered in UC Medications  ketorolac (TORADOL) 30 MG/ML injection 30 mg (has no administration in time range)    Initial Impression / Assessment and Plan / UC Course  I have reviewed the triage vital signs and the nursing notes.  Pertinent labs & imaging results that were available during my care of the patient were reviewed by me and considered in my medical decision making (see chart for details).    Final Clinical Impressions(s) / UC Diagnoses   Final diagnoses:  Acute left-sided low back pain without sciatica     Discharge Instructions      You were seen today for back pain.  I have given you a shot of toradol today for pain.  I have sent out a muscle relaxer to take when home and not driving as this can make you sleepy.  I recommend motrin, as well as heat for pain.  Please return or follow up with your primary care provider if not improving as expected.     ED Prescriptions     Medication Sig Dispense Auth. Provider   cyclobenzaprine (FLEXERIL) 10 MG tablet Take 1 tablet (10 mg total) by mouth 2 (two) times daily as needed for muscle spasms. 20 tablet Rondel Oh, MD      PDMP not reviewed this encounter.   Rondel Oh, MD 01/29/22 1515

## 2022-01-29 NOTE — ED Triage Notes (Addendum)
Pt reports left lower back pain after lifting stuff yesterday. Tried taking an ibuprofen with no relief. Denies radiation.

## 2022-03-01 ENCOUNTER — Ambulatory Visit (HOSPITAL_COMMUNITY)
Admission: EM | Admit: 2022-03-01 | Discharge: 2022-03-01 | Disposition: A | Discharge status: Home / Self Care | Payer: Commercial Managed Care - HMO | Attending: Emergency Medicine | Admitting: Emergency Medicine

## 2022-03-01 ENCOUNTER — Encounter (HOSPITAL_COMMUNITY): Payer: Self-pay

## 2022-03-01 DIAGNOSIS — M545 Low back pain, unspecified: Secondary | ICD-10-CM

## 2022-03-01 LAB — POCT URINALYSIS DIPSTICK, ED / UC
Bilirubin Urine: NEGATIVE
Glucose, UA: NEGATIVE mg/dL
Hgb urine dipstick: NEGATIVE
Ketones, ur: NEGATIVE mg/dL
Leukocytes,Ua: NEGATIVE
Nitrite: NEGATIVE
Protein, ur: NEGATIVE mg/dL
Specific Gravity, Urine: 1.02 (ref 1.005–1.030)
Urobilinogen, UA: 0.2 mg/dL (ref 0.0–1.0)
pH: 5.5 (ref 5.0–8.0)

## 2022-03-01 MED ORDER — KETOROLAC TROMETHAMINE 30 MG/ML IJ SOLN
INTRAMUSCULAR | Status: AC
  Filled 2022-03-01: qty 1

## 2022-03-01 MED ORDER — KETOROLAC TROMETHAMINE 30 MG/ML IJ SOLN
30.0000 mg | Freq: Once | INTRAMUSCULAR | Status: AC
  Administered 2022-03-01: 30 mg via INTRAMUSCULAR

## 2022-03-01 MED ORDER — CYCLOBENZAPRINE HCL 10 MG PO TABS: 10.0000 mg | ORAL_TABLET | Freq: Two times a day (BID) | ORAL | 0 refills | Status: DC | PRN

## 2022-03-01 NOTE — ED Provider Notes (Signed)
[facility]    CSN: [id number] Arrival date & time: 03/01/22  1042      History   Chief Complaint Chief Complaint  Patient presents with   Back Pain    HPI Chasen Weathers is a 26 y.o. male.  Patient reports left-sided back pain that started a few days ago.  Denies any fall or trauma.  He states that he believes onset of symptoms began after doing some lifting at work.  Patient reports a previous episode with similar symptoms and circumstances after lifting, he states he was seen at this office in October.  Patient reports that his symptoms resolved after treatment on January 29, 2022 until this current episode.  He reports increased pain with lifting. Patient denies any numbness or tingling down his legs.  Patient denies any dysuria, flank pain, fever, or chills. Patient denies any incontinence of bowel or bladder.     Back Pain   Past Medical History:  Diagnosis Date   Pneumonia     There are no problems to display for this patient.   Past Surgical History:  Procedure Laterality Date   APPENDECTOMY         Home Medications    Prior to Admission medications   Medication Sig Start Date End Date Taking? Authorizing Provider  cyclobenzaprine (FLEXERIL) 10 MG tablet Take 1 tablet (10 mg total) by mouth 2 (two) times daily as needed for muscle spasms. 03/01/22  Yes Debby Freiberg, NP  cetirizine (ZYRTEC ALLERGY) 10 MG tablet Take 1 tablet (10 mg total) by mouth daily. 06/08/20 07/20/20  Wallis Bamberg, PA-C    Family History Family History  Problem Relation Age of Onset   Hypertension Mother     Social History Social History   Tobacco Use   Smoking status: Never   Smokeless tobacco: Never  Substance Use Topics   Alcohol use: No   Drug use: No     Allergies   Amoxicillin   Review of Systems Review of Systems  Musculoskeletal:  Positive for back pain.   Per HPI  Physical Exam Triage Vital Signs ED Triage Vitals  Enc Vitals Group     BP  03/01/22 1155 115/78     Pulse Rate 03/01/22 1155 82     Resp 03/01/22 1155 16     Temp 03/01/22 1155 (!) 97.4 F (36.3 C)     Temp Source 03/01/22 1155 Oral     SpO2 03/01/22 1155 98 %     Weight --      Height --      Head Circumference --      Peak Flow --      Pain Score 03/01/22 1157 8     Pain Loc --      Pain Edu? --      Excl. in [place]? --    No data found.  Updated Vital Signs BP 115/78 (BP Location: Left Arm)   Pulse 82   Temp (!) 97.4 F (36.3 C) (Oral)   Resp 16   SpO2 98%   Physical Exam Vitals and nursing note reviewed.  Constitutional:      Appearance: Normal appearance.  Abdominal:     Tenderness: There is left CVA tenderness. There is no right CVA tenderness.  Musculoskeletal:     Cervical back: Normal.     Thoracic back: Tenderness present. No swelling, edema, deformity, signs of trauma, lacerations, spasms or bony tenderness. Decreased range of motion. No scoliosis.  Lumbar back: Tenderness present. No swelling, edema, deformity, signs of trauma, lacerations, spasms or bony tenderness. Decreased range of motion. Negative right straight leg raise test and negative left straight leg raise test. No scoliosis.     Comments: Left sided thoracic and lumbar back tenderness upon palpation.  Normal ROM and normal strength of upper and lower extremities.   Skin:    Comments: Normal appearance to skin on cervical, thoracic, and lumbar area.   Neurological:     General: No focal deficit present.     Mental Status: He is alert.     GCS: GCS eye subscore is 4. GCS verbal subscore is 5. GCS motor subscore is 6.      UC Treatments / Results  Labs (all labs ordered are listed, but only abnormal results are displayed) Labs Reviewed  POCT URINALYSIS DIPSTICK, ED / UC    EKG   Radiology No results found.  Procedures Procedures (including critical care time)  Medications Ordered in UC Medications  ketorolac (TORADOL) 30 MG/ML injection 30 mg (30 mg  Intramuscular Given 03/01/22 1301)    Initial Impression / Assessment and Plan / UC Course  I have reviewed the triage vital signs and the nursing notes.  Pertinent labs & imaging results that were available during my care of the patient were reviewed by me and considered in my medical decision making (see chart for details).     Patient was evaluated for left-sided lower back pain.  Urinalysis was unremarkable. Patient was given a Toradol injection in office.  Flexeril is a muscle relaxant that was sent to the pharmacy. He was made aware of safety precautions with muscle relaxant.  Patient was made aware of timeline for resolution of symptoms and when follow-up would be necessary.  Patient verbalized understanding of instructions.  Charting was provided using a a verbal dictation system, charting was proofread for errors, errors may occur which could change the meaning of the information charted.   Final Clinical Impressions(s) / UC Diagnoses   Final diagnoses:  Acute left-sided low back pain without sciatica     Discharge Instructions      Flexeril is a muscle relaxant that is been sent to the pharmacy, you can take this 2 times daily as needed.  This medication can make you feel sleepy, please do not operate a car or heavy machinery after taking this medication.   [facility] information has been provided in the case that symptoms do not improve, I suggest that you follow-up with this office.  You may follow-up with [place] if necessary as well.      ED Prescriptions     Medication Sig Dispense Auth. Provider   cyclobenzaprine (FLEXERIL) 10 MG tablet Take 1 tablet (10 mg total) by mouth 2 (two) times daily as needed for muscle spasms. 20 tablet Debby Freiberg, NP      PDMP not reviewed this encounter.   Debby Freiberg, NP 03/01/22 1554

## 2022-03-01 NOTE — Discharge Instructions (Addendum)
Flexeril is a muscle relaxant that is been sent to the pharmacy, you can take this 2 times daily as needed.  This medication can make you feel sleepy, please do not operate a car or heavy machinery after taking this medication.   [facility] information has been provided in the case that symptoms do not improve, I suggest that you follow-up with this office.  You may follow-up with [place] if necessary as well.

## 2022-03-01 NOTE — ED Triage Notes (Signed)
Pt states back pain worse on the left. States he does heavy lifting at work. Took Tylenol with no relief.

## 2022-03-14 ENCOUNTER — Other Ambulatory Visit: Payer: Self-pay

## 2022-03-14 ENCOUNTER — Emergency Department (HOSPITAL_COMMUNITY)
Admission: EM | Admit: 2022-03-14 | Discharge: 2022-03-14 | Disposition: A | Discharge status: Home / Self Care | Payer: Commercial Managed Care - HMO | Attending: Emergency Medicine | Admitting: Emergency Medicine

## 2022-03-14 ENCOUNTER — Encounter (HOSPITAL_COMMUNITY): Payer: Self-pay

## 2022-03-14 DIAGNOSIS — Z20822 Contact with and (suspected) exposure to covid-19: Secondary | ICD-10-CM | POA: Diagnosis not present

## 2022-03-14 DIAGNOSIS — J069 Acute upper respiratory infection, unspecified: Secondary | ICD-10-CM | POA: Insufficient documentation

## 2022-03-14 DIAGNOSIS — R0981 Nasal congestion: Secondary | ICD-10-CM | POA: Diagnosis present

## 2022-03-14 LAB — RESP PANEL BY RT-PCR (RSV, FLU A&B, COVID)  RVPGX2
Influenza A by PCR: NEGATIVE
Influenza B by PCR: NEGATIVE
Resp Syncytial Virus by PCR: NEGATIVE
SARS Coronavirus 2 by RT PCR: NEGATIVE

## 2022-03-14 MED ORDER — IBUPROFEN 800 MG PO TABS: 800.0000 mg | ORAL_TABLET | Freq: Three times a day (TID) | ORAL | 0 refills | Status: DC

## 2022-03-14 MED ORDER — BENZONATATE 100 MG PO CAPS: 100.0000 mg | ORAL_CAPSULE | Freq: Three times a day (TID) | ORAL | 0 refills | Status: DC

## 2022-03-14 NOTE — Discharge Instructions (Signed)
Make sure to rest and hydrate. Medications sent to pharmacy to help with symptoms. Return here for new concerns.

## 2022-03-14 NOTE — ED Triage Notes (Signed)
C/o cold sx, subjective fevers, chills, weakness, cough, and loss of appetite x 2 days.

## 2022-03-14 NOTE — ED Provider Notes (Signed)
[facility]-EMERGENCY DEPT Provider Note   CSN: [id number] Arrival date & time: 03/14/22  0136     History  Chief Complaint  Patient presents with   URI    Jimmy Powers is a 26 y.o. male.  The history is provided by the patient and medical records.  URI Presenting symptoms: congestion and cough   Associated symptoms: myalgias    25 year old male presenting to the ED with upper respiratory symptoms for the past 2 days.  Specifically he has had nasal congestion, rhinorrhea, cough, poor appetite, and generalized weakness.  He works for [employer] and symptoms began when working outside for several hours in the cold.  He has not had any noted sick contacts.  He was taking DayQuil temporarily with some mild relief but continues to feel poorly.  He has not had any vomiting or diarrhea.  Home Medications Prior to Admission medications   Medication Sig Start Date End Date Taking? Authorizing Provider  cyclobenzaprine (FLEXERIL) 10 MG tablet Take 1 tablet (10 mg total) by mouth 2 (two) times daily as needed for muscle spasms. 03/01/22   Debby Freiberg, NP  cetirizine (ZYRTEC ALLERGY) 10 MG tablet Take 1 tablet (10 mg total) by mouth daily. 06/08/20 07/20/20  Wallis Bamberg, PA-C      Allergies    Amoxicillin    Review of Systems   Review of Systems  Constitutional:  Positive for chills.  HENT:  Positive for congestion.   Respiratory:  Positive for cough.   Musculoskeletal:  Positive for myalgias.  All other systems reviewed and are negative.   Physical Exam Updated Vital Signs BP (!) 121/96 (BP Location: Right Arm)   Pulse (!) 110   Temp 98.4 F (36.9 C) (Oral)   Resp 19   Ht 5\' 11"  (1.803 m)   Wt 79.4 kg   SpO2 98%   BMI 24.41 kg/m   Physical Exam Vitals and nursing note reviewed.  Constitutional:      Appearance: He is well-developed.  HENT:     Head: Normocephalic and atraumatic.     Right Ear: Tympanic membrane and ear canal normal.     Left  Ear: Tympanic membrane and ear canal normal.     Nose: Congestion present.     Mouth/Throat:     Lips: Pink.     Pharynx: Oropharynx is clear. Uvula midline.  Eyes:     Conjunctiva/sclera: Conjunctivae normal.     Pupils: Pupils are equal, round, and reactive to light.  Cardiovascular:     Rate and Rhythm: Normal rate and regular rhythm.     Heart sounds: Normal heart sounds.  Pulmonary:     Effort: Pulmonary effort is normal. No respiratory distress.     Breath sounds: Normal breath sounds. No rhonchi.  Musculoskeletal:        General: Normal range of motion.     Cervical back: Normal range of motion.  Skin:    General: Skin is warm and dry.  Neurological:     Mental Status: He is alert and oriented to person, place, and time.     ED Results / Procedures / Treatments   Labs (all labs ordered are listed, but only abnormal results are displayed) Labs Reviewed  RESP PANEL BY RT-PCR (RSV, FLU A&B, COVID)  RVPGX2    EKG None  Radiology No results found.  Procedures Procedures    Medications Ordered in ED Medications - No data to display  ED Course/ Medical Decision Making/  A&P                           Medical Decision Making  26 y.o. M here with 2 days of URI symptoms.  Afebrile, non-toxic.  Does have some congestion on exam but lungs sounds clear bilaterally.  No respiratory distress.  4-plex testing is negative.  Suspect likely viral process.  Appears stable for discharge with continued symptomatic care.  Close PCP follow-up encouraged.  Work note provided.  Can return here for new concerns.  Final Clinical Impression(s) / ED Diagnoses Final diagnoses:  Upper respiratory tract infection, unspecified type    Rx / DC Orders ED Discharge Orders     None         Garlon Hatchet, PA-C 03/14/22 0534    Palumbo, April, MD 03/14/22 [phone]

## 2022-03-15 ENCOUNTER — Telehealth: Payer: Self-pay

## 2022-03-15 NOTE — Patient Outreach (Signed)
Transition Care Management Unsuccessful Follow-up Telephone Call  Date of discharge and from where:  03/14/22 [facility]   Attempts:  1st Attempt  Reason for unsuccessful TCM follow-up call:  Left voice message  Gus Puma, [place], [place] [facility]  [place]  High Risk Managed Medicaid Team  [phone]

## 2022-03-18 ENCOUNTER — Telehealth: Payer: Self-pay

## 2022-03-18 NOTE — Patient Outreach (Signed)
Transition Care Management Unsuccessful Follow-up Telephone Call  Date of discharge and from where:  03/14/22 [facility]   Attempts:  2nd Attempt  Reason for unsuccessful TCM follow-up call:  Left voice message  Gus Puma, BSW, [place] [facility]  [facility]  High Risk Managed Medicaid Team  [phone]

## 2022-04-05 ENCOUNTER — Ambulatory Visit
Admission: EM | Admit: 2022-04-05 | Discharge: 2022-04-05 | Disposition: A | Discharge status: Home / Self Care | Payer: Commercial Managed Care - HMO | Attending: Nurse Practitioner | Admitting: Nurse Practitioner

## 2022-04-05 ENCOUNTER — Encounter: Payer: Self-pay | Admitting: Emergency Medicine

## 2022-04-05 DIAGNOSIS — R197 Diarrhea, unspecified: Secondary | ICD-10-CM

## 2022-04-05 DIAGNOSIS — A084 Viral intestinal infection, unspecified: Secondary | ICD-10-CM

## 2022-04-05 DIAGNOSIS — R112 Nausea with vomiting, unspecified: Secondary | ICD-10-CM | POA: Diagnosis not present

## 2022-04-05 LAB — POCT INFLUENZA A/B
Influenza A, POC: NEGATIVE
Influenza B, POC: NEGATIVE

## 2022-04-05 NOTE — ED Provider Notes (Signed)
[facility]    CSN: [id number] Arrival date & time: 04/05/22  1406      History   Chief Complaint Chief Complaint  Patient presents with   Abdominal Pain    HPI Jimmy Powers is a 27 y.o. male Patient is a 27 y.o. male who presents for evaluation of nausea, vomiting, diarrhea and abdominal pain for 1 days. Patient reports 1 episodes of vomiting. Patient reports 7 episodes of diarrhea. Vomit and stool are non bloody.  Diarrhea is liquidy no formed stool.  States he ate normally yesterday until the evening when he ate some hamburger helper and vomited x 1.  Since then he has been having diarrhea with generalized abdominal pain.  No other household members have similar symptoms.  Denies any recent travel.  No history of GI diagnoses including Crohn's or IBS.  He took Pepto last night.  No fever, chills, body aches, rash or syncope.  No other concerns at this time.    Abdominal Pain Associated symptoms: diarrhea, nausea and vomiting     Past Medical History:  Diagnosis Date   Pneumonia     There are no problems to display for this patient.   Past Surgical History:  Procedure Laterality Date   APPENDECTOMY         Home Medications    Prior to Admission medications   Medication Sig Start Date End Date Taking? Authorizing Provider  benzonatate (TESSALON) 100 MG capsule Take 1 capsule (100 mg total) by mouth every 8 (eight) hours. 03/14/22   Larene Pickett, PA-C  cyclobenzaprine (FLEXERIL) 10 MG tablet Take 1 tablet (10 mg total) by mouth 2 (two) times daily as needed for muscle spasms. 03/01/22   Flossie Dibble, NP  ibuprofen (ADVIL) 800 MG tablet Take 1 tablet (800 mg total) by mouth 3 (three) times daily. 03/14/22   Larene Pickett, PA-C  cetirizine (ZYRTEC ALLERGY) 10 MG tablet Take 1 tablet (10 mg total) by mouth daily. 06/08/20 07/20/20  Jaynee Eagles, PA-C    Family History Family History  Problem Relation Age of Onset   Hypertension Mother      Social History Social History   Tobacco Use   Smoking status: Never   Smokeless tobacco: Never  Substance Use Topics   Alcohol use: No   Drug use: No     Allergies   Amoxicillin   Review of Systems Review of Systems  Gastrointestinal:  Positive for abdominal pain, diarrhea, nausea and vomiting.     Physical Exam Triage Vital Signs ED Triage Vitals  Enc Vitals Group     BP 04/05/22 1504 117/68     Pulse Rate 04/05/22 1504 93     Resp 04/05/22 1504 18     Temp 04/05/22 1504 98 F (36.7 C)     Temp Source 04/05/22 1504 Oral     SpO2 04/05/22 1504 98 %     Weight 04/05/22 1507 165 lb (74.8 kg)     Height 04/05/22 1507 5' 10.5" (1.791 m)     Head Circumference --      Peak Flow --      Pain Score 04/05/22 1507 7     Pain Loc --      Pain Edu? --      Excl. in [place]? --    No data found.  Updated Vital Signs BP 117/68 (BP Location: Left Arm)   Pulse 93   Temp 98 F (36.7 C) (Oral)   Resp 18  Ht 5' 10.5" (1.791 m)   Wt 165 lb (74.8 kg)   SpO2 98%   BMI 23.34 kg/m   Visual Acuity Right Eye Distance:   Left Eye Distance:   Bilateral Distance:    Right Eye Near:   Left Eye Near:    Bilateral Near:     Physical Exam Vitals and nursing note reviewed.  Constitutional:      General: He is not in acute distress.    Appearance: Normal appearance. He is well-developed. He is not ill-appearing or toxic-appearing.  HENT:     Head: Normocephalic and atraumatic.     Right Ear: Tympanic membrane and ear canal normal.     Left Ear: Tympanic membrane and ear canal normal.  Eyes:     Pupils: Pupils are equal, round, and reactive to light.  Cardiovascular:     Rate and Rhythm: Normal rate and regular rhythm.     Heart sounds: Normal heart sounds.  Pulmonary:     Effort: Pulmonary effort is normal.     Breath sounds: Normal breath sounds.  Abdominal:     General: Bowel sounds are normal. There is no distension.     Tenderness: There is generalized  abdominal tenderness. There is no right CVA tenderness, left CVA tenderness or guarding. Negative signs include Rovsing's sign and McBurney's sign.  Musculoskeletal:     Cervical back: Normal range of motion and neck supple.  Lymphadenopathy:     Cervical: No cervical adenopathy.  Skin:    General: Skin is warm and dry.  Neurological:     General: No focal deficit present.     Mental Status: He is alert and oriented to person, place, and time.  Psychiatric:        Mood and Affect: Mood normal.        Behavior: Behavior normal.      UC Treatments / Results  Labs (all labs ordered are listed, but only abnormal results are displayed) Labs Reviewed  POCT INFLUENZA A/B    EKG   Radiology No results found.  Procedures Procedures (including critical care time)  Medications Ordered in UC Medications - No data to display  Initial Impression / Assessment and Plan / UC Course  I have reviewed the triage vital signs and the nursing notes.  Pertinent labs & imaging results that were available during my care of the patient were reviewed by me and considered in my medical decision making (see chart for details).     Negative rapid flu.  Discussed viral enteritis and symptomatic treatment States nausea and vomiting have resolved and declines Zofran OTC Imodium or Pepto as needed Rest and fluids advised brat diet and advance as tolerated PCP follow-up 2 to 3 days for recheck ER for any worsening symptoms Final Clinical Impressions(s) / UC Diagnoses   Final diagnoses:  Diarrhea, unspecified type  Nausea and vomiting, unspecified vomiting type  Viral enteritis     Discharge Instructions       Brat diet and advance as tolerated May use over-the-counter Imodium as needed to control diarrhea Rest and fluids Please follow-up with your PCP 2 to 3 days for recheck Please go to the ER for any worsening symptoms     ED Prescriptions   None    PDMP not reviewed this  encounter.   Melynda Ripple, NP 04/05/22 [phone]

## 2022-04-05 NOTE — ED Triage Notes (Signed)
Patient c/o vomiting, diarrhea, no fever x 1 day.  Patient denies any OTC meds.

## 2022-04-05 NOTE — Discharge Instructions (Signed)
  Brat diet and advance as tolerated May use over-the-counter Imodium as needed to control diarrhea Rest and fluids Please follow-up with your PCP 2 to 3 days for recheck Please go to the ER for any worsening symptoms

## 2022-04-15 ENCOUNTER — Telehealth: Payer: Self-pay

## 2022-04-15 NOTE — Telephone Encounter (Signed)
Mychart msg sent. AS, CMA 

## 2022-04-29 ENCOUNTER — Encounter (HOSPITAL_COMMUNITY): Payer: Self-pay

## 2022-04-29 ENCOUNTER — Emergency Department (HOSPITAL_COMMUNITY)
Admission: EM | Admit: 2022-04-29 | Discharge: 2022-04-29 | Disposition: A | Discharge status: Home / Self Care | Payer: Medicaid Other | Attending: Emergency Medicine | Admitting: Emergency Medicine

## 2022-04-29 ENCOUNTER — Other Ambulatory Visit: Payer: Self-pay

## 2022-04-29 DIAGNOSIS — S39012A Strain of muscle, fascia and tendon of lower back, initial encounter: Secondary | ICD-10-CM

## 2022-04-29 DIAGNOSIS — X500XXA Overexertion from strenuous movement or load, initial encounter: Secondary | ICD-10-CM | POA: Diagnosis not present

## 2022-04-29 DIAGNOSIS — S3992XA Unspecified injury of lower back, initial encounter: Secondary | ICD-10-CM | POA: Diagnosis present

## 2022-04-29 MED ORDER — LIDOCAINE 5 % EX PTCH
1.0000 | MEDICATED_PATCH | CUTANEOUS | Status: DC
  Administered 2022-04-29: 1 via TRANSDERMAL
  Filled 2022-04-29: qty 1

## 2022-04-29 MED ORDER — KETOROLAC TROMETHAMINE 15 MG/ML IJ SOLN
15.0000 mg | Freq: Once | INTRAMUSCULAR | Status: AC
  Administered 2022-04-29: 15 mg via INTRAMUSCULAR
  Filled 2022-04-29: qty 1

## 2022-04-29 MED ORDER — NAPROXEN 500 MG PO TABS: 500.0000 mg | ORAL_TABLET | Freq: Two times a day (BID) | ORAL | 0 refills | Status: DC

## 2022-04-29 MED ORDER — METHOCARBAMOL 500 MG PO TABS: 500.0000 mg | ORAL_TABLET | Freq: Two times a day (BID) | ORAL | 0 refills | Status: DC | PRN

## 2022-04-29 NOTE — Discharge Instructions (Addendum)
Alternate ice and heat to areas of injury 3-4 times per day to limit inflammation and spasm.  Avoid strenuous activity and heavy lifting.  We recommend consistent use of naproxen in addition to Robaxin for muscle spasms. Do not drive or drink alcohol after taking Robaxin as it may make you drowsy and impair your judgment.  We recommend follow-up with a primary care doctor to ensure resolution of symptoms.  Return to the ED for any new or concerning symptoms. 

## 2022-04-29 NOTE — ED Triage Notes (Signed)
Lower midline back pain beginning this past Friday after picking up his niece.   Says he has had several similar episodes recently.   Has been taking tylenol with minimal improvement.

## 2022-04-29 NOTE — ED Provider Notes (Signed)
[place] EMERGENCY DEPARTMENT AT [facility] Provider Note   CSN: [id number] Arrival date & time: 04/29/22  0149     History  Chief Complaint  Patient presents with   Back Pain    Jimmy Powers is a 27 y.o. male.  27 y/o male presenting for back pain. Reports hx of similar pain that recurred on Friday after picking up his niece. Pain is stabbing to his right low back. He has been taking Tylenol for symptoms without relief. Does frequent heavy lifting as he works for [employer]. Denies fever, genital or perianal numbness, bowel or bladder incontinence, inability to ambulate, IV drug use.  The history is provided by the patient. No language interpreter was used.  Back Pain      Home Medications Prior to Admission medications   Medication Sig Start Date End Date Taking? Authorizing Provider  methocarbamol (ROBAXIN) 500 MG tablet Take 1 tablet (500 mg total) by mouth every 12 (twelve) hours as needed for muscle spasms. 04/29/22  Yes Antonietta Breach, PA-C  naproxen (NAPROSYN) 500 MG tablet Take 1 tablet (500 mg total) by mouth 2 (two) times daily. 04/29/22  Yes Antonietta Breach, PA-C  benzonatate (TESSALON) 100 MG capsule Take 1 capsule (100 mg total) by mouth every 8 (eight) hours. 03/14/22   Larene Pickett, PA-C  cetirizine (ZYRTEC ALLERGY) 10 MG tablet Take 1 tablet (10 mg total) by mouth daily. 06/08/20 07/20/20  Jaynee Eagles, PA-C      Allergies    Amoxicillin    Review of Systems   Review of Systems  Musculoskeletal:  Positive for back pain.  Ten systems reviewed and are negative for acute change, except as noted in the HPI.    Physical Exam Updated Vital Signs BP 103/80 (BP Location: Left Arm)   Pulse (!) 108   Temp 98.2 F (36.8 C) (Oral)   Resp 16   Ht 5\' 10"  (1.778 m)   Wt 74.8 kg   SpO2 100%   BMI 23.68 kg/m   Physical Exam Vitals and nursing note reviewed.  Constitutional:      General: He is not in acute distress.    Appearance: He is well-developed. He  is not diaphoretic.     Comments: Nontoxic appearing and in NAD  HENT:     Head: Normocephalic and atraumatic.  Eyes:     General: No scleral icterus.    Conjunctiva/sclera: Conjunctivae normal.  Pulmonary:     Effort: Pulmonary effort is normal. No respiratory distress.     Comments: Respirations even and unlabored Musculoskeletal:        General: Normal range of motion.     Cervical back: Normal range of motion.     Comments: TTP just to the right of the lumbar midline without appreciable spasm. No bony deformities, step-offs, crepitus to the lumbosacral midline.  Skin:    General: Skin is warm and dry.     Coloration: Skin is not pale.     Findings: No erythema or rash.  Neurological:     Mental Status: He is alert and oriented to person, place, and time.     Coordination: Coordination normal.     Comments: Ambulatory with steady gait.  Psychiatric:        Behavior: Behavior normal.     ED Results / Procedures / Treatments   Labs (all labs ordered are listed, but only abnormal results are displayed) Labs Reviewed - No data to display  EKG None  Radiology No  results found.  Procedures Procedures    Medications Ordered in ED Medications  ketorolac (TORADOL) 15 MG/ML injection 15 mg (has no administration in time range)  lidocaine (LIDODERM) 5 % 1 patch (has no administration in time range)    ED Course/ Medical Decision Making/ A&P                             Medical Decision Making Risk Prescription drug management.   This patient presents to the ED for concern of low back pain, this involves an extensive number of treatment options, and is a complaint that carries with it a high risk of complications and morbidity.  The differential diagnosis includes strain vs fracture vs listhesis vs herniated disc vs epidural abscess vs cauda equina.   Co morbidities that complicate the patient evaluation  Back pain   Cardiac Monitoring:  The patient was  maintained on a cardiac monitor.  I personally viewed and interpreted the cardiac monitored which showed an underlying rhythm of: mild tachycardia   Medicines ordered and prescription drug management:  I ordered medication including Toradol and Lidoderm patch for pain  Reevaluation of the patient after these medicines showed that the patient improved I have reviewed the patients home medicines and have made adjustments as needed   Test Considered:  Xray lumbar spine - however no hx of trauma   Problem List / ED Course:  Patient with acute exacerbation of chronic low back pain.  He has noted to be neurovascularly intact on physical exam.  No red flags or signs concerning for cauda equina. Denies history of fevers and is noted to be afebrile in the ED.  Also no history of IV drug use.  Doubt epidural abscess or discitis/osteomyelitis as cause of pain. He is having no associated radicular symptoms. Suspect lumbar strain. Will discharge with NSAIDs and Robaxin for management. Encouraged f/u with a PCP.   Reevaluation:  After the interventions noted above, I reevaluated the patient and found that they have : remained stable   Social Determinants of Health:  Insured patient   Dispostion:  After consideration of the diagnostic results and the patients response to treatment, I feel that the patent would benefit from outpatient supportive care with NSAIDs, Robaxin. Return precautions discussed and provided. Patient discharged in stable condition with no unaddressed concerns.          Final Clinical Impression(s) / ED Diagnoses Final diagnoses:  Strain of lumbar region, initial encounter    Rx / DC Orders ED Discharge Orders          Ordered    naproxen (NAPROSYN) 500 MG tablet  2 times daily        04/29/22 0217    methocarbamol (ROBAXIN) 500 MG tablet  Every 12 hours PRN        04/29/22 0217              Antonietta Breach, PA-C 04/29/22 0229    Molpus, Jenny Reichmann,  MD 04/29/22 [phone]

## 2022-06-19 ENCOUNTER — Encounter (HOSPITAL_COMMUNITY): Payer: Self-pay

## 2022-06-19 ENCOUNTER — Inpatient Hospital Stay (HOSPITAL_COMMUNITY)
Admission: EM | Admit: 2022-06-19 | Discharge: 2022-06-21 | DRG: 392 | Disposition: A | Discharge status: Home / Self Care | Payer: Medicaid Other | Attending: Family Medicine | Admitting: Family Medicine

## 2022-06-19 ENCOUNTER — Emergency Department (HOSPITAL_COMMUNITY): Payer: Medicaid Other

## 2022-06-19 ENCOUNTER — Other Ambulatory Visit: Payer: Self-pay

## 2022-06-19 DIAGNOSIS — D509 Iron deficiency anemia, unspecified: Secondary | ICD-10-CM | POA: Diagnosis present

## 2022-06-19 DIAGNOSIS — H209 Unspecified iridocyclitis: Secondary | ICD-10-CM | POA: Insufficient documentation

## 2022-06-19 DIAGNOSIS — D569 Thalassemia, unspecified: Secondary | ICD-10-CM | POA: Diagnosis present

## 2022-06-19 DIAGNOSIS — K311 Adult hypertrophic pyloric stenosis: Principal | ICD-10-CM

## 2022-06-19 DIAGNOSIS — R590 Localized enlarged lymph nodes: Secondary | ICD-10-CM | POA: Diagnosis present

## 2022-06-19 DIAGNOSIS — R109 Unspecified abdominal pain: Secondary | ICD-10-CM | POA: Diagnosis not present

## 2022-06-19 DIAGNOSIS — K529 Noninfective gastroenteritis and colitis, unspecified: Principal | ICD-10-CM | POA: Diagnosis present

## 2022-06-19 DIAGNOSIS — Z79899 Other long term (current) drug therapy: Secondary | ICD-10-CM

## 2022-06-19 DIAGNOSIS — Z8249 Family history of ischemic heart disease and other diseases of the circulatory system: Secondary | ICD-10-CM

## 2022-06-19 DIAGNOSIS — Z1152 Encounter for screening for COVID-19: Secondary | ICD-10-CM

## 2022-06-19 DIAGNOSIS — Z880 Allergy status to penicillin: Secondary | ICD-10-CM

## 2022-06-19 LAB — CBC
HCT: 41.3 % (ref 39.0–52.0)
Hemoglobin: 13.2 g/dL (ref 13.0–17.0)
MCH: 23.7 pg — ABNORMAL LOW (ref 26.0–34.0)
MCHC: 32 g/dL (ref 30.0–36.0)
MCV: 74 fL — ABNORMAL LOW (ref 80.0–100.0)
Platelets: 207 10*3/uL (ref 150–400)
RBC: 5.58 MIL/uL (ref 4.22–5.81)
RDW: 14.5 % (ref 11.5–15.5)
WBC: 4.7 10*3/uL (ref 4.0–10.5)
nRBC: 0 % (ref 0.0–0.2)

## 2022-06-19 LAB — COMPREHENSIVE METABOLIC PANEL
ALT: 40 U/L (ref 0–44)
AST: 43 U/L — ABNORMAL HIGH (ref 15–41)
Albumin: 3.7 g/dL (ref 3.5–5.0)
Alkaline Phosphatase: 228 U/L — ABNORMAL HIGH (ref 38–126)
Anion gap: 7 (ref 5–15)
BUN: 11 mg/dL (ref 6–20)
CO2: 25 mmol/L (ref 22–32)
Calcium: 8.7 mg/dL — ABNORMAL LOW (ref 8.9–10.3)
Chloride: 103 mmol/L (ref 98–111)
Creatinine, Ser: 0.99 mg/dL (ref 0.61–1.24)
GFR, Estimated: >60 mL/min (ref >60)
Glucose, Bld: 114 mg/dL — ABNORMAL HIGH (ref 70–99)
Potassium: 3.7 mmol/L (ref 3.5–5.1)
Sodium: 135 mmol/L (ref 135–145)
Total Bilirubin: 0.8 mg/dL (ref 0.3–1.2)
Total Protein: 7.8 g/dL (ref 6.5–8.1)

## 2022-06-19 LAB — URINALYSIS, ROUTINE W REFLEX MICROSCOPIC
Bilirubin Urine: NEGATIVE
Glucose, UA: NEGATIVE mg/dL
Hgb urine dipstick: NEGATIVE
Ketones, ur: NEGATIVE mg/dL
Leukocytes,Ua: NEGATIVE
Nitrite: NEGATIVE
Protein, ur: NEGATIVE mg/dL
Specific Gravity, Urine: 1.019 (ref 1.005–1.030)
pH: 5 (ref 5.0–8.0)

## 2022-06-19 LAB — RESP PANEL BY RT-PCR (RSV, FLU A&B, COVID)  RVPGX2
Influenza A by PCR: NEGATIVE
Influenza B by PCR: NEGATIVE
Resp Syncytial Virus by PCR: NEGATIVE
SARS Coronavirus 2 by RT PCR: NEGATIVE

## 2022-06-19 LAB — LIPASE, BLOOD: Lipase: 40 U/L (ref 11–51)

## 2022-06-19 MED ORDER — DICYCLOMINE HCL 10 MG PO CAPS
10.0000 mg | ORAL_CAPSULE | Freq: Once | ORAL | Status: AC
  Administered 2022-06-19: 10 mg via ORAL
  Filled 2022-06-19: qty 1

## 2022-06-19 MED ORDER — ONDANSETRON 4 MG PO TBDP: 4.0000 mg | ORAL_TABLET | Freq: Once | ORAL | Status: DC | PRN

## 2022-06-19 MED ORDER — IOHEXOL 300 MG/ML  SOLN
100.0000 mL | Freq: Once | INTRAMUSCULAR | Status: AC | PRN
  Administered 2022-06-19: 100 mL via INTRAVENOUS

## 2022-06-19 NOTE — ED Triage Notes (Signed)
Pt came in with c/o stomach pain. Pt has been feeling bad for a few days but it got worse today.

## 2022-06-19 NOTE — ED Provider Notes (Signed)
[place] EMERGENCY DEPARTMENT AT [facility] Provider Note   CSN: [id number] Arrival date & time: 06/19/22  2119     History {Add pertinent medical, surgical, social history, OB history to HPI:1} Chief Complaint  Patient presents with   Abdominal Pain    Jimmy Powers is a 27 y.o. male.   Abdominal Pain    This is a 27 year old male status post appendectomy presenting to the emergency department due to abdominal pain started a month ago diffusely and was associated with emesis and diarrhea, seen in the ED, given antiemetics and it resolved.  Started again 1 week ago on Thursday, has been constant and all over his abdomen worse to the left side.  Eating makes it worse, diarrhea initially that is resolved.  Having nausea, 1 episode of emesis today without blood.  Dark brown stools, no blood or melena or mucus.  Does not radiate elsewhere, no dysuria, hematuria, recent antibiotic use, history of UC or Crohn's.  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  Home Medications Prior to Admission medications   Medication Sig Start Date End Date Taking? Authorizing Provider  benzonatate (TESSALON) 100 MG capsule Take 1 capsule (100 mg total) by mouth every 8 (eight) hours. 03/14/22   Larene Pickett, PA-C  methocarbamol (ROBAXIN) 500 MG tablet Take 1 tablet (500 mg total) by mouth every 12 (twelve) hours as needed for muscle spasms. 04/29/22   Antonietta Breach, PA-C  naproxen (NAPROSYN) 500 MG tablet Take 1 tablet (500 mg total) by mouth 2 (two) times daily. 04/29/22   Antonietta Breach, PA-C  cetirizine (ZYRTEC ALLERGY) 10 MG tablet Take 1 tablet (10 mg total) by mouth daily. 06/08/20 07/20/20  Jaynee Eagles, PA-C      Allergies    Amoxicillin    Review of Systems   Review of Systems  Gastrointestinal:  Positive for abdominal pain.    Physical Exam Updated Vital Signs BP 122/81   Pulse (!) 107    Temp 98.4 F (36.9 C) (Oral)   Resp 18   Ht 5' 10.5" (1.791 m)   Wt 70.3 kg   SpO2 100%   BMI 21.93 kg/m  Physical Exam  ED Results / Procedures / Treatments   Labs (all labs ordered are listed, but only abnormal results are displayed) Labs Reviewed  CBC - Abnormal; Notable for the following components:      Result Value   MCV 74.0 (*)    MCH 23.7 (*)    All other components within normal limits  RESP PANEL BY RT-PCR (RSV, FLU A&B, COVID)  RVPGX2  LIPASE, BLOOD  COMPREHENSIVE METABOLIC PANEL  URINALYSIS, ROUTINE W REFLEX MICROSCOPIC    EKG None  Radiology No results found.  Procedures Procedures  {Document cardiac monitor, telemetry assessment procedure when appropriate:1}  Medications Ordered in ED Medications  ondansetron (ZOFRAN-ODT) disintegrating tablet 4 mg (has no administration in time range)  dicyclomine (BENTYL) capsule 10 mg (10 mg Oral Given 06/19/22 2219)    ED Course/ Medical Decision Making/ A&P   {   Click here for ABCD2, HEART and other calculatorsREFRESH Note before signing :1}                          Medical Decision Making Amount and/or Complexity of Data Reviewed Labs: ordered.  Risk Prescription drug management.   ***  {Document critical care  time when appropriate:1} {Document review of labs and clinical decision tools ie heart score, Chads2Vasc2 etc:1}  {Document your independent review of radiology images, and any outside records:1} {Document your discussion with family members, caretakers, and with consultants:1} {Document social determinants of health affecting pt's care:1} {Document your decision making why or why not admission, treatments were needed:1} Final Clinical Impression(s) / ED Diagnoses Final diagnoses:  None    Rx / DC Orders ED Discharge Orders     None

## 2022-06-20 ENCOUNTER — Emergency Department (HOSPITAL_COMMUNITY): Payer: Medicaid Other

## 2022-06-20 ENCOUNTER — Telehealth: Payer: Self-pay

## 2022-06-20 DIAGNOSIS — K529 Noninfective gastroenteritis and colitis, unspecified: Secondary | ICD-10-CM | POA: Diagnosis not present

## 2022-06-20 DIAGNOSIS — Z4659 Encounter for fitting and adjustment of other gastrointestinal appliance and device: Secondary | ICD-10-CM | POA: Diagnosis not present

## 2022-06-20 DIAGNOSIS — Z8249 Family history of ischemic heart disease and other diseases of the circulatory system: Secondary | ICD-10-CM | POA: Diagnosis not present

## 2022-06-20 DIAGNOSIS — H209 Unspecified iridocyclitis: Secondary | ICD-10-CM | POA: Diagnosis not present

## 2022-06-20 DIAGNOSIS — K311 Adult hypertrophic pyloric stenosis: Secondary | ICD-10-CM | POA: Diagnosis not present

## 2022-06-20 DIAGNOSIS — Z79899 Other long term (current) drug therapy: Secondary | ICD-10-CM | POA: Diagnosis not present

## 2022-06-20 DIAGNOSIS — R112 Nausea with vomiting, unspecified: Secondary | ICD-10-CM | POA: Diagnosis not present

## 2022-06-20 DIAGNOSIS — R634 Abnormal weight loss: Secondary | ICD-10-CM | POA: Diagnosis not present

## 2022-06-20 DIAGNOSIS — D569 Thalassemia, unspecified: Secondary | ICD-10-CM | POA: Diagnosis not present

## 2022-06-20 DIAGNOSIS — R109 Unspecified abdominal pain: Secondary | ICD-10-CM | POA: Diagnosis not present

## 2022-06-20 DIAGNOSIS — Z1152 Encounter for screening for COVID-19: Secondary | ICD-10-CM | POA: Diagnosis not present

## 2022-06-20 DIAGNOSIS — R590 Localized enlarged lymph nodes: Secondary | ICD-10-CM | POA: Diagnosis not present

## 2022-06-20 DIAGNOSIS — D509 Iron deficiency anemia, unspecified: Secondary | ICD-10-CM | POA: Diagnosis not present

## 2022-06-20 DIAGNOSIS — R933 Abnormal findings on diagnostic imaging of other parts of digestive tract: Secondary | ICD-10-CM | POA: Diagnosis not present

## 2022-06-20 DIAGNOSIS — Z880 Allergy status to penicillin: Secondary | ICD-10-CM | POA: Diagnosis not present

## 2022-06-20 LAB — COMPREHENSIVE METABOLIC PANEL
ALT: 42 U/L (ref 0–44)
ALT: 40 U/L (ref 0–44)
AST: 39 U/L (ref 15–41)
AST: 36 U/L (ref 15–41)
Albumin: 3.6 g/dL (ref 3.5–5.0)
Albumin: 3.7 g/dL (ref 3.5–5.0)
Alkaline Phosphatase: 218 U/L — ABNORMAL HIGH (ref 38–126)
Alkaline Phosphatase: 223 U/L — ABNORMAL HIGH (ref 38–126)
Anion gap: 10 (ref 5–15)
Anion gap: 7 (ref 5–15)
BUN: 13 mg/dL (ref 6–20)
BUN: 13 mg/dL (ref 6–20)
CO2: 26 mmol/L (ref 22–32)
CO2: 26 mmol/L (ref 22–32)
Calcium: 9 mg/dL (ref 8.9–10.3)
Calcium: 8.9 mg/dL (ref 8.9–10.3)
Chloride: 101 mmol/L (ref 98–111)
Chloride: 104 mmol/L (ref 98–111)
Creatinine, Ser: 1.11 mg/dL (ref 0.61–1.24)
Creatinine, Ser: 0.95 mg/dL (ref 0.61–1.24)
GFR, Estimated: >60 mL/min (ref >60)
GFR, Estimated: >60 mL/min (ref >60)
Glucose, Bld: 122 mg/dL — ABNORMAL HIGH (ref 70–99)
Glucose, Bld: 121 mg/dL — ABNORMAL HIGH (ref 70–99)
Potassium: 3.5 mmol/L (ref 3.5–5.1)
Potassium: 3.8 mmol/L (ref 3.5–5.1)
Sodium: 137 mmol/L (ref 135–145)
Sodium: 137 mmol/L (ref 135–145)
Total Bilirubin: 0.5 mg/dL (ref 0.3–1.2)
Total Bilirubin: 0.8 mg/dL (ref 0.3–1.2)
Total Protein: 7.9 g/dL (ref 6.5–8.1)
Total Protein: 7.7 g/dL (ref 6.5–8.1)

## 2022-06-20 LAB — CBC WITH DIFFERENTIAL/PLATELET
Abs Immature Granulocytes: 0.01 10*3/uL (ref 0.00–0.07)
Basophils Absolute: 0.1 10*3/uL (ref 0.0–0.1)
Basophils Relative: 1 %
Eosinophils Absolute: 0.5 10*3/uL (ref 0.0–0.5)
Eosinophils Relative: 8 %
HCT: 40.3 % (ref 39.0–52.0)
Hemoglobin: 13 g/dL (ref 13.0–17.0)
Immature Granulocytes: 0 %
Lymphocytes Relative: 11 %
Lymphs Abs: 0.6 10*3/uL — ABNORMAL LOW (ref 0.7–4.0)
MCH: 23.9 pg — ABNORMAL LOW (ref 26.0–34.0)
MCHC: 32.3 g/dL (ref 30.0–36.0)
MCV: 74.2 fL — ABNORMAL LOW (ref 80.0–100.0)
Monocytes Absolute: 0.8 10*3/uL (ref 0.1–1.0)
Monocytes Relative: 13 %
Neutro Abs: 3.9 10*3/uL (ref 1.7–7.7)
Neutrophils Relative %: 67 %
Platelets: 188 10*3/uL (ref 150–400)
RBC: 5.43 MIL/uL (ref 4.22–5.81)
RDW: 14.4 % (ref 11.5–15.5)
WBC: 5.8 10*3/uL (ref 4.0–10.5)
nRBC: 0 % (ref 0.0–0.2)

## 2022-06-20 LAB — SEDIMENTATION RATE: Sed Rate: 17 mm/hr — ABNORMAL HIGH (ref 0–16)

## 2022-06-20 LAB — PSA: Prostatic Specific Antigen: 0.76 ng/mL (ref 0.00–4.00)

## 2022-06-20 LAB — C-REACTIVE PROTEIN: CRP: 2 mg/dL — ABNORMAL HIGH (ref <1.0)

## 2022-06-20 MED ORDER — PHENOL 1.4 % MT LIQD: 1.0000 | OROMUCOSAL | Status: DC | PRN

## 2022-06-20 MED ORDER — LIDOCAINE HCL 2 % IJ SOLN
INTRAMUSCULAR | Status: AC
  Filled 2022-06-20: qty 20

## 2022-06-20 MED ORDER — PREDNISOLONE ACETATE 1 % OP SUSP
1.0000 [drp] | OPHTHALMIC | Status: DC
  Administered 2022-06-20 – 2022-06-21 (×5): 1 [drp] via OPHTHALMIC
  Filled 2022-06-20: qty 5

## 2022-06-20 MED ORDER — ACETAMINOPHEN 325 MG PO TABS: 650.0000 mg | ORAL_TABLET | Freq: Four times a day (QID) | ORAL | Status: DC | PRN

## 2022-06-20 MED ORDER — HYDROMORPHONE HCL 1 MG/ML IJ SOLN
1.0000 mg | Freq: Once | INTRAMUSCULAR | Status: AC
  Administered 2022-06-20: 1 mg via INTRAVENOUS
  Filled 2022-06-20: qty 1

## 2022-06-20 MED ORDER — LIDOCAINE HCL 2 % IJ SOLN: 10.0000 mL | Freq: Once | INTRAMUSCULAR | Status: DC

## 2022-06-20 MED ORDER — POTASSIUM CHLORIDE IN NACL 20-0.9 MEQ/L-% IV SOLN
INTRAVENOUS | Status: DC
  Filled 2022-06-20 (×3): qty 1000

## 2022-06-20 MED ORDER — LORAZEPAM 2 MG/ML IJ SOLN
1.0000 mg | Freq: Once | INTRAMUSCULAR | Status: AC
  Administered 2022-06-20: 1 mg via INTRAVENOUS
  Filled 2022-06-20: qty 1

## 2022-06-20 MED ORDER — ACETAMINOPHEN 650 MG RE SUPP: 650.0000 mg | Freq: Four times a day (QID) | RECTAL | Status: DC | PRN

## 2022-06-20 MED ORDER — MORPHINE SULFATE (PF) 2 MG/ML IV SOLN: 1.0000 mg | INTRAVENOUS | Status: DC | PRN

## 2022-06-20 MED ORDER — ENOXAPARIN SODIUM 40 MG/0.4ML IJ SOSY
40.0000 mg | PREFILLED_SYRINGE | INTRAMUSCULAR | Status: DC
  Administered 2022-06-20 – 2022-06-21 (×2): 40 mg via SUBCUTANEOUS
  Filled 2022-06-20 (×2): qty 0.4

## 2022-06-20 MED ORDER — LIDOCAINE HCL (PF) 2% IJ FOR NEBU
5.0000 mL | Freq: Once | RESPIRATORY_TRACT | Status: AC
  Administered 2022-06-20: 5 mL via RESPIRATORY_TRACT
  Filled 2022-06-20: qty 5

## 2022-06-20 MED ORDER — PANTOPRAZOLE SODIUM 40 MG IV SOLR
40.0000 mg | Freq: Two times a day (BID) | INTRAVENOUS | Status: DC
  Administered 2022-06-20 – 2022-06-21 (×3): 40 mg via INTRAVENOUS
  Filled 2022-06-20 (×3): qty 10

## 2022-06-20 MED ORDER — ORAL CARE MOUTH RINSE: 15.0000 mL | OROMUCOSAL | Status: DC | PRN

## 2022-06-20 NOTE — Consult Note (Signed)
Reason for Consult: Gastric Outlet Obstruction Referring Physician: [facility]  Delma Freeze HPI: This is a 27 year old male with a PMH of Bell's Palsy and iritis who presents to the ER with complaints of nausea, vomiting, and abdominal pain.  His symptoms started earlier this year and he was evaluated in the ER, but he was able to be discharged home.  However, his current symptoms were more intense and it started this past week.  During the prior ER visits no imaging was obtained.  With this current admission, the CT scan was positive for a gastric outlet obstruction.  With this finding there was a reported history of a 70 lbs weight loss, which started in October.  At that time he did not notice any problems with nausea/vomiting.  He denies using any NSAIDs.  The patient does not report any problems with abdominal pain, hematemesis, hematochezia, or melena.  Past Medical History:  Diagnosis Date   Pneumonia     Past Surgical History:  Procedure Laterality Date   APPENDECTOMY      Family History  Problem Relation Age of Onset   Hypertension Mother     Social History:  reports that he has never smoked. He has never used smokeless tobacco. He reports that he does not drink alcohol and does not use drugs.  Allergies:  Allergies  Allergen Reactions   Amoxicillin Rash    Medications: Scheduled:  enoxaparin (LOVENOX) injection  40 mg Subcutaneous Q24H   lidocaine       pantoprazole (PROTONIX) IV  40 mg Intravenous Q12H   prednisoLONE acetate  1 drop Right Eye Q3H   Continuous:  0.9 % NaCl with KCl 20 mEq / L 100 mL/hr at 06/20/22 [id number]    Results for orders placed or performed during the hospital encounter of 06/19/22 (from the past 24 hour(s))  Urinalysis, Routine w reflex microscopic -Urine, Clean Catch     Status: None   Collection Time: 06/19/22  9:28 PM  Result Value Ref Range   Color, Urine YELLOW YELLOW   APPearance CLEAR CLEAR   Specific Gravity, Urine 1.019  1.005 - 1.030   pH 5.0 5.0 - 8.0   Glucose, UA NEGATIVE NEGATIVE mg/dL   Hgb urine dipstick NEGATIVE NEGATIVE   Bilirubin Urine NEGATIVE NEGATIVE   Ketones, ur NEGATIVE NEGATIVE mg/dL   Protein, ur NEGATIVE NEGATIVE mg/dL   Nitrite NEGATIVE NEGATIVE   Leukocytes,Ua NEGATIVE NEGATIVE  Lipase, blood     Status: None   Collection Time: 06/19/22  9:44 PM  Result Value Ref Range   Lipase 40 11 - 51 U/L  Comprehensive metabolic panel     Status: Abnormal   Collection Time: 06/19/22  9:44 PM  Result Value Ref Range   Sodium 135 135 - 145 mmol/L   Potassium 3.7 3.5 - 5.1 mmol/L   Chloride 103 98 - 111 mmol/L   CO2 25 22 - 32 mmol/L   Glucose, Bld 114 (H) 70 - 99 mg/dL   BUN 11 6 - 20 mg/dL   Creatinine, Ser 0.99 0.61 - 1.24 mg/dL   Calcium 8.7 (L) 8.9 - 10.3 mg/dL   Total Protein 7.8 6.5 - 8.1 g/dL   Albumin 3.7 3.5 - 5.0 g/dL   AST 43 (H) 15 - 41 U/L   ALT 40 0 - 44 U/L   Alkaline Phosphatase 228 (H) 38 - 126 U/L   Total Bilirubin 0.8 0.3 - 1.2 mg/dL   GFR, Estimated >60 >60 mL/min  Anion gap 7 5 - 15  CBC     Status: Abnormal   Collection Time: 06/19/22  9:44 PM  Result Value Ref Range   WBC 4.7 4.0 - 10.5 K/uL   RBC 5.58 4.22 - 5.81 MIL/uL   Hemoglobin 13.2 13.0 - 17.0 g/dL   HCT 41.3 39.0 - 52.0 %   MCV 74.0 (L) 80.0 - 100.0 fL   MCH 23.7 (L) 26.0 - 34.0 pg   MCHC 32.0 30.0 - 36.0 g/dL   RDW 14.5 11.5 - 15.5 %   Platelets 207 150 - 400 K/uL   nRBC 0.0 0.0 - 0.2 %  Resp panel by RT-PCR (RSV, Flu A&B, Covid) Anterior Nasal Swab     Status: None   Collection Time: 06/19/22 10:12 PM   Specimen: Anterior Nasal Swab  Result Value Ref Range   SARS Coronavirus 2 by RT PCR NEGATIVE NEGATIVE   Influenza A by PCR NEGATIVE NEGATIVE   Influenza B by PCR NEGATIVE NEGATIVE   Resp Syncytial Virus by PCR NEGATIVE NEGATIVE  Sedimentation rate     Status: Abnormal   Collection Time: 06/20/22  4:11 AM  Result Value Ref Range   Sed Rate 17 (H) 0 - 16 mm/hr  C-reactive protein      Status: Abnormal   Collection Time: 06/20/22  4:11 AM  Result Value Ref Range   CRP 2.0 (H) <1.0 mg/dL  Comprehensive metabolic panel     Status: Abnormal   Collection Time: 06/20/22  4:11 AM  Result Value Ref Range   Sodium 137 135 - 145 mmol/L   Potassium 3.5 3.5 - 5.1 mmol/L   Chloride 101 98 - 111 mmol/L   CO2 26 22 - 32 mmol/L   Glucose, Bld 122 (H) 70 - 99 mg/dL   BUN 13 6 - 20 mg/dL   Creatinine, Ser 1.11 0.61 - 1.24 mg/dL   Calcium 9.0 8.9 - 10.3 mg/dL   Total Protein 7.9 6.5 - 8.1 g/dL   Albumin 3.6 3.5 - 5.0 g/dL   AST 39 15 - 41 U/L   ALT 42 0 - 44 U/L   Alkaline Phosphatase 218 (H) 38 - 126 U/L   Total Bilirubin 0.5 0.3 - 1.2 mg/dL   GFR, Estimated >60 >60 mL/min   Anion gap 10 5 - 15  CBC with Differential/Platelet     Status: Abnormal   Collection Time: 06/20/22  4:11 AM  Result Value Ref Range   WBC 5.8 4.0 - 10.5 K/uL   RBC 5.43 4.22 - 5.81 MIL/uL   Hemoglobin 13.0 13.0 - 17.0 g/dL   HCT 40.3 39.0 - 52.0 %   MCV 74.2 (L) 80.0 - 100.0 fL   MCH 23.9 (L) 26.0 - 34.0 pg   MCHC 32.3 30.0 - 36.0 g/dL   RDW 14.4 11.5 - 15.5 %   Platelets 188 150 - 400 K/uL   nRBC 0.0 0.0 - 0.2 %   Neutrophils Relative % 67 %   Neutro Abs 3.9 1.7 - 7.7 K/uL   Lymphocytes Relative 11 %   Lymphs Abs 0.6 (L) 0.7 - 4.0 K/uL   Monocytes Relative 13 %   Monocytes Absolute 0.8 0.1 - 1.0 K/uL   Eosinophils Relative 8 %   Eosinophils Absolute 0.5 0.0 - 0.5 K/uL   Basophils Relative 1 %   Basophils Absolute 0.1 0.0 - 0.1 K/uL   Immature Granulocytes 0 %   Abs Immature Granulocytes 0.01 0.00 - 0.07 K/uL  PSA  Status: None   Collection Time: 06/20/22  7:48 AM  Result Value Ref Range   Prostatic Specific Antigen 0.76 0.00 - 4.00 ng/mL  Comprehensive metabolic panel     Status: Abnormal   Collection Time: 06/20/22  7:48 AM  Result Value Ref Range   Sodium 137 135 - 145 mmol/L   Potassium 3.8 3.5 - 5.1 mmol/L   Chloride 104 98 - 111 mmol/L   CO2 26 22 - 32 mmol/L   Glucose, Bld  121 (H) 70 - 99 mg/dL   BUN 13 6 - 20 mg/dL   Creatinine, Ser 0.95 0.61 - 1.24 mg/dL   Calcium 8.9 8.9 - 10.3 mg/dL   Total Protein 7.7 6.5 - 8.1 g/dL   Albumin 3.7 3.5 - 5.0 g/dL   AST 36 15 - 41 U/L   ALT 40 0 - 44 U/L   Alkaline Phosphatase 223 (H) 38 - 126 U/L   Total Bilirubin 0.8 0.3 - 1.2 mg/dL   GFR, Estimated >60 >60 mL/min   Anion gap 7 5 - 15     DG Abd Portable 1 View  Result Date: 06/20/2022 CLINICAL DATA:  NG tube placement EXAM: PORTABLE ABDOMEN - 1 VIEW COMPARISON:  None Available. FINDINGS: NG tube tip is in the mid stomach. Nonobstructive bowel gas pattern. IMPRESSION: NG tube tip in the mid stomach. Electronically Signed   By: Rolm Baptise M.D.   On: 06/20/2022 03:24   CT Abdomen Pelvis W Contrast  Result Date: 06/19/2022 CLINICAL DATA:  Abdominal pain. EXAM: CT ABDOMEN AND PELVIS WITH CONTRAST TECHNIQUE: Multidetector CT imaging of the abdomen and pelvis was performed using the standard protocol following bolus administration of intravenous contrast. RADIATION DOSE REDUCTION: This exam was performed according to the departmental dose-optimization program which includes automated exposure control, adjustment of the mA and/or kV according to patient size and/or use of iterative reconstruction technique. CONTRAST:  128mL OMNIPAQUE IOHEXOL 300 MG/ML  SOLN COMPARISON:  None Available. FINDINGS: Lower chest: No acute abnormality. Hepatobiliary: No focal liver abnormality is seen. No gallstones, gallbladder wall thickening, or biliary dilatation. Pancreas: Unremarkable. No pancreatic ductal dilatation or surrounding inflammatory changes. Spleen: Normal in size without focal abnormality. Adrenals/Urinary Tract: Adrenal glands are unremarkable. Kidneys are normal, without renal calculi, focal lesion, or hydronephrosis. The urinary bladder is poorly distended and subsequently limited in evaluation Stomach/Bowel: Stomach is moderately distended and contains a large amount of  heterogeneous ingested material the duodenal bulb is also prominent. Appendix appears normal. No evidence of bowel wall thickening, distention, or inflammatory changes. Vascular/Lymphatic: No significant vascular findings are present. There is mild to moderate severity bilateral inguinal lymphadenopathy. Bilateral subcentimeter cervical chain lymph nodes are also seen. Reproductive: Prostate is unremarkable. Other: No abdominal wall hernia or abnormality. No abdominopelvic ascites. Musculoskeletal: No acute or significant osseous findings. IMPRESSION: 1. Large amount of heterogeneous ingested material within a distended stomach which may represent sequelae associated with gastric outlet obstruction. 2. Mild to moderate severity bilateral inguinal lymphadenopathy, likely reactive in nature. Electronically Signed   By: Virgina Norfolk M.D.   On: 06/19/2022 23:39    ROS:  As stated above in the HPI otherwise negative.  Blood pressure 135/81, pulse 99, temperature 98 F (36.7 C), temperature source Oral, resp. rate 17, height 5' 10.5" (1.791 m), weight 70.3 kg, SpO2 98 %.    PE: Gen: NAD, Alert and Oriented HEENT:  [place]/AT, EOMI Neck: Supple, no LAD Lungs: CTA Bilaterally CV: RRR without M/G/R ABD: Soft, NTND, +BS Ext:  No C/C/E  Assessment/Plan: 1) Gastric outlet obstruction. 2) Abdominal pain. 3) Weight loss.   Further evaluation is required with an EGD.  It is anticipated that he will have a full stomach and he will need to be under general anesthesia.  Plan: 1) EGD with possible dilation.  Riannon Mukherjee D 06/20/2022, 11:24 AM

## 2022-06-20 NOTE — ED Notes (Addendum)
Attempted NG tube insertion, could not advance in L nostril, pt had increased difficulty tolerating attempt. Pt refused having another attempt with Paramedic and RN. Notified EDP.

## 2022-06-20 NOTE — Progress Notes (Signed)
    Patient: Jimmy Powers [id number] DOB: 22-Mar-1996      Brief hospital course: Mr. Hinsdale is a 27 y.o. M with hx iritis, Bell's palsy who presented with few months weight loss of 50 lbs and now acute abdominal pain and vomiting.  This is the third episode of the same in 2 months (1/5 and 1/29).  In the ER, CT showed reactive inguinal lymphadenopathy and gastric distension with possible outlet obstruction.  NG placed and admitted and GI consulted.    This is a no charge note, for further details, please see the H&P by my partner, Dr. Claria Dice from earlier today.   Principal Problem:   Gastric outlet obstruction Active Problems:   Iritis    No further vomiting or output from NG. GI have seen, hopefully EGD tomorrow Labs unremarkable  D/C NG NPO        Physical Exam: BP 135/81 (BP Location: Left Arm)   Pulse 99   Temp 98 F (36.7 C) (Oral)   Resp 17   Ht 5' 10.5" (1.791 m)   Wt 70.3 kg   SpO2 98%   BMI 21.93 kg/m   Patient seen and examined.      Family Communication: None present        Author: Edwin Dada, MD 06/20/2022 10:29 AM

## 2022-06-20 NOTE — H&P (View-Only) (Signed)
Reason for Consult: Gastric Outlet Obstruction Referring Physician: [facility]  Delma Freeze HPI: This is a 27 year old male with a PMH of Bell's Palsy and iritis who presents to the ER with complaints of nausea, vomiting, and abdominal pain.  His symptoms started earlier this year and he was evaluated in the ER, but he was able to be discharged home.  However, his current symptoms were more intense and it started this past week.  During the prior ER visits no imaging was obtained.  With this current admission, the CT scan was positive for a gastric outlet obstruction.  With this finding there was a reported history of a 70 lbs weight loss, which started in October.  At that time he did not notice any problems with nausea/vomiting.  He denies using any NSAIDs.  The patient does not report any problems with abdominal pain, hematemesis, hematochezia, or melena.  Past Medical History:  Diagnosis Date   Pneumonia     Past Surgical History:  Procedure Laterality Date   APPENDECTOMY      Family History  Problem Relation Age of Onset   Hypertension Mother     Social History:  reports that he has never smoked. He has never used smokeless tobacco. He reports that he does not drink alcohol and does not use drugs.  Allergies:  Allergies  Allergen Reactions   Amoxicillin Rash    Medications: Scheduled:  enoxaparin (LOVENOX) injection  40 mg Subcutaneous Q24H   lidocaine       pantoprazole (PROTONIX) IV  40 mg Intravenous Q12H   prednisoLONE acetate  1 drop Right Eye Q3H   Continuous:  0.9 % NaCl with KCl 20 mEq / L 100 mL/hr at 06/20/22 [id number]    Results for orders placed or performed during the hospital encounter of 06/19/22 (from the past 24 hour(s))  Urinalysis, Routine w reflex microscopic -Urine, Clean Catch     Status: None   Collection Time: 06/19/22  9:28 PM  Result Value Ref Range   Color, Urine YELLOW YELLOW   APPearance CLEAR CLEAR   Specific Gravity, Urine 1.019  1.005 - 1.030   pH 5.0 5.0 - 8.0   Glucose, UA NEGATIVE NEGATIVE mg/dL   Hgb urine dipstick NEGATIVE NEGATIVE   Bilirubin Urine NEGATIVE NEGATIVE   Ketones, ur NEGATIVE NEGATIVE mg/dL   Protein, ur NEGATIVE NEGATIVE mg/dL   Nitrite NEGATIVE NEGATIVE   Leukocytes,Ua NEGATIVE NEGATIVE  Lipase, blood     Status: None   Collection Time: 06/19/22  9:44 PM  Result Value Ref Range   Lipase 40 11 - 51 U/L  Comprehensive metabolic panel     Status: Abnormal   Collection Time: 06/19/22  9:44 PM  Result Value Ref Range   Sodium 135 135 - 145 mmol/L   Potassium 3.7 3.5 - 5.1 mmol/L   Chloride 103 98 - 111 mmol/L   CO2 25 22 - 32 mmol/L   Glucose, Bld 114 (H) 70 - 99 mg/dL   BUN 11 6 - 20 mg/dL   Creatinine, Ser 0.99 0.61 - 1.24 mg/dL   Calcium 8.7 (L) 8.9 - 10.3 mg/dL   Total Protein 7.8 6.5 - 8.1 g/dL   Albumin 3.7 3.5 - 5.0 g/dL   AST 43 (H) 15 - 41 U/L   ALT 40 0 - 44 U/L   Alkaline Phosphatase 228 (H) 38 - 126 U/L   Total Bilirubin 0.8 0.3 - 1.2 mg/dL   GFR, Estimated >60 >60 mL/min  Anion gap 7 5 - 15  CBC     Status: Abnormal   Collection Time: 06/19/22  9:44 PM  Result Value Ref Range   WBC 4.7 4.0 - 10.5 K/uL   RBC 5.58 4.22 - 5.81 MIL/uL   Hemoglobin 13.2 13.0 - 17.0 g/dL   HCT 41.3 39.0 - 52.0 %   MCV 74.0 (L) 80.0 - 100.0 fL   MCH 23.7 (L) 26.0 - 34.0 pg   MCHC 32.0 30.0 - 36.0 g/dL   RDW 14.5 11.5 - 15.5 %   Platelets 207 150 - 400 K/uL   nRBC 0.0 0.0 - 0.2 %  Resp panel by RT-PCR (RSV, Flu A&B, Covid) Anterior Nasal Swab     Status: None   Collection Time: 06/19/22 10:12 PM   Specimen: Anterior Nasal Swab  Result Value Ref Range   SARS Coronavirus 2 by RT PCR NEGATIVE NEGATIVE   Influenza A by PCR NEGATIVE NEGATIVE   Influenza B by PCR NEGATIVE NEGATIVE   Resp Syncytial Virus by PCR NEGATIVE NEGATIVE  Sedimentation rate     Status: Abnormal   Collection Time: 06/20/22  4:11 AM  Result Value Ref Range   Sed Rate 17 (H) 0 - 16 mm/hr  C-reactive protein      Status: Abnormal   Collection Time: 06/20/22  4:11 AM  Result Value Ref Range   CRP 2.0 (H) <1.0 mg/dL  Comprehensive metabolic panel     Status: Abnormal   Collection Time: 06/20/22  4:11 AM  Result Value Ref Range   Sodium 137 135 - 145 mmol/L   Potassium 3.5 3.5 - 5.1 mmol/L   Chloride 101 98 - 111 mmol/L   CO2 26 22 - 32 mmol/L   Glucose, Bld 122 (H) 70 - 99 mg/dL   BUN 13 6 - 20 mg/dL   Creatinine, Ser 1.11 0.61 - 1.24 mg/dL   Calcium 9.0 8.9 - 10.3 mg/dL   Total Protein 7.9 6.5 - 8.1 g/dL   Albumin 3.6 3.5 - 5.0 g/dL   AST 39 15 - 41 U/L   ALT 42 0 - 44 U/L   Alkaline Phosphatase 218 (H) 38 - 126 U/L   Total Bilirubin 0.5 0.3 - 1.2 mg/dL   GFR, Estimated >60 >60 mL/min   Anion gap 10 5 - 15  CBC with Differential/Platelet     Status: Abnormal   Collection Time: 06/20/22  4:11 AM  Result Value Ref Range   WBC 5.8 4.0 - 10.5 K/uL   RBC 5.43 4.22 - 5.81 MIL/uL   Hemoglobin 13.0 13.0 - 17.0 g/dL   HCT 40.3 39.0 - 52.0 %   MCV 74.2 (L) 80.0 - 100.0 fL   MCH 23.9 (L) 26.0 - 34.0 pg   MCHC 32.3 30.0 - 36.0 g/dL   RDW 14.4 11.5 - 15.5 %   Platelets 188 150 - 400 K/uL   nRBC 0.0 0.0 - 0.2 %   Neutrophils Relative % 67 %   Neutro Abs 3.9 1.7 - 7.7 K/uL   Lymphocytes Relative 11 %   Lymphs Abs 0.6 (L) 0.7 - 4.0 K/uL   Monocytes Relative 13 %   Monocytes Absolute 0.8 0.1 - 1.0 K/uL   Eosinophils Relative 8 %   Eosinophils Absolute 0.5 0.0 - 0.5 K/uL   Basophils Relative 1 %   Basophils Absolute 0.1 0.0 - 0.1 K/uL   Immature Granulocytes 0 %   Abs Immature Granulocytes 0.01 0.00 - 0.07 K/uL  PSA  Status: None   Collection Time: 06/20/22  7:48 AM  Result Value Ref Range   Prostatic Specific Antigen 0.76 0.00 - 4.00 ng/mL  Comprehensive metabolic panel     Status: Abnormal   Collection Time: 06/20/22  7:48 AM  Result Value Ref Range   Sodium 137 135 - 145 mmol/L   Potassium 3.8 3.5 - 5.1 mmol/L   Chloride 104 98 - 111 mmol/L   CO2 26 22 - 32 mmol/L   Glucose, Bld  121 (H) 70 - 99 mg/dL   BUN 13 6 - 20 mg/dL   Creatinine, Ser 0.95 0.61 - 1.24 mg/dL   Calcium 8.9 8.9 - 10.3 mg/dL   Total Protein 7.7 6.5 - 8.1 g/dL   Albumin 3.7 3.5 - 5.0 g/dL   AST 36 15 - 41 U/L   ALT 40 0 - 44 U/L   Alkaline Phosphatase 223 (H) 38 - 126 U/L   Total Bilirubin 0.8 0.3 - 1.2 mg/dL   GFR, Estimated >60 >60 mL/min   Anion gap 7 5 - 15     DG Abd Portable 1 View  Result Date: 06/20/2022 CLINICAL DATA:  NG tube placement EXAM: PORTABLE ABDOMEN - 1 VIEW COMPARISON:  None Available. FINDINGS: NG tube tip is in the mid stomach. Nonobstructive bowel gas pattern. IMPRESSION: NG tube tip in the mid stomach. Electronically Signed   By: Rolm Baptise M.D.   On: 06/20/2022 03:24   CT Abdomen Pelvis W Contrast  Result Date: 06/19/2022 CLINICAL DATA:  Abdominal pain. EXAM: CT ABDOMEN AND PELVIS WITH CONTRAST TECHNIQUE: Multidetector CT imaging of the abdomen and pelvis was performed using the standard protocol following bolus administration of intravenous contrast. RADIATION DOSE REDUCTION: This exam was performed according to the departmental dose-optimization program which includes automated exposure control, adjustment of the mA and/or kV according to patient size and/or use of iterative reconstruction technique. CONTRAST:  177mL OMNIPAQUE IOHEXOL 300 MG/ML  SOLN COMPARISON:  None Available. FINDINGS: Lower chest: No acute abnormality. Hepatobiliary: No focal liver abnormality is seen. No gallstones, gallbladder wall thickening, or biliary dilatation. Pancreas: Unremarkable. No pancreatic ductal dilatation or surrounding inflammatory changes. Spleen: Normal in size without focal abnormality. Adrenals/Urinary Tract: Adrenal glands are unremarkable. Kidneys are normal, without renal calculi, focal lesion, or hydronephrosis. The urinary bladder is poorly distended and subsequently limited in evaluation Stomach/Bowel: Stomach is moderately distended and contains a large amount of  heterogeneous ingested material the duodenal bulb is also prominent. Appendix appears normal. No evidence of bowel wall thickening, distention, or inflammatory changes. Vascular/Lymphatic: No significant vascular findings are present. There is mild to moderate severity bilateral inguinal lymphadenopathy. Bilateral subcentimeter cervical chain lymph nodes are also seen. Reproductive: Prostate is unremarkable. Other: No abdominal wall hernia or abnormality. No abdominopelvic ascites. Musculoskeletal: No acute or significant osseous findings. IMPRESSION: 1. Large amount of heterogeneous ingested material within a distended stomach which may represent sequelae associated with gastric outlet obstruction. 2. Mild to moderate severity bilateral inguinal lymphadenopathy, likely reactive in nature. Electronically Signed   By: Virgina Norfolk M.D.   On: 06/19/2022 23:39    ROS:  As stated above in the HPI otherwise negative.  Blood pressure 135/81, pulse 99, temperature 98 F (36.7 C), temperature source Oral, resp. rate 17, height 5' 10.5" (1.791 m), weight 70.3 kg, SpO2 98 %.    PE: Gen: NAD, Alert and Oriented HEENT:  [place]/AT, EOMI Neck: Supple, no LAD Lungs: CTA Bilaterally CV: RRR without M/G/R ABD: Soft, NTND, +BS Ext:  No C/C/E  Assessment/Plan: 1) Gastric outlet obstruction. 2) Abdominal pain. 3) Weight loss.   Further evaluation is required with an EGD.  It is anticipated that he will have a full stomach and he will need to be under general anesthesia.  Plan: 1) EGD with possible dilation.  Lorn Butcher D 06/20/2022, 11:24 AM

## 2022-06-20 NOTE — H&P (Addendum)
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  Patient still being maintained on steroid eyedrops.  He presents with abdominal pain, nausea, and vomiting.  Per patient symptoms developed last night, were persistent he came to the ER.  Per patient he had a similar episode approximately 1 month ago 04/29/22 [primary low back pain], and 2 months ago 04/05/22 [abdominal pain, nausea, vomiting and diarrhea].  He presented to the ER, was given medications, was discharged and symptoms resolved.  Yesterday the symptoms were much worse.  He additionally reports significant unintentional weight loss over the last 6 months approximately 70 pounds.  He believes he is gone from 220 pounds to 155 pounds since October.  He reports early satiety.  Over the last month after eating a larger meal, he will get some abdominal cramping and diarrhea.  Over the past few weeks he reports heaviness and some upper extremity joint pains.  He denies any significant NSAID use.  He denies any notable malabsorption symptoms.  He denies heartburn or GERD symptoms.  He denies any family history of autoimmune issues.  His mom had recurrent pancreatitis.  He denies any alcohol use.  In the ER CT abdomen pelvis shows large amount of heterogeneous ingested material within a distended stomach which may represent sequelae associated with gastric outlet obstruction.  It additionally showed, mild to moderate severity bilateral inguinal lymphadenopathy, likely reactive in nature.  NG tube has been placed  Patient's aunt present at bedside.  Mother's phone.  All questions answered.  Review of Systems:  The patient denies anorexia, fever, decreased hearing, hoarseness, chest pain, syncope, dyspnea on exertion, peripheral edema, balance deficits, hemoptysis, melena, hematochezia, severe  indigestion/heartburn, hematuria, incontinence, genital sores, muscle weakness, suspicious skin lesions, transient blindness, difficulty walking, depression, abnormal bleeding, angioedema, and breast masses. Positives: Nausea, vomiting, diarrhea, weight loss, malnutrition, early satiety, right-sided vision loss, Bell's palsy,  Past Medical History: Past Medical History:  Diagnosis Date   Pneumonia    Past Surgical History:  Procedure Laterality Date   APPENDECTOMY      Medications: Prior to Admission medications   Medication Sig Start Date End Date Taking? Authorizing Provider  benzonatate (TESSALON) 100 MG capsule Take 1 capsule (100 mg total) by mouth every 8 (eight) hours. 03/14/22   Larene Pickett, PA-C  methocarbamol (ROBAXIN) 500 MG tablet Take 1 tablet (500 mg total) by mouth every 12 (twelve) hours as needed for muscle spasms. 04/29/22   Antonietta Breach, PA-C  naproxen (NAPROSYN) 500 MG tablet Take 1 tablet (500 mg total) by mouth 2 (two) times daily. 04/29/22   Antonietta Breach, PA-C  cetirizine (ZYRTEC ALLERGY) 10 MG tablet Take 1 tablet (10 mg total) by mouth daily. 06/08/20 07/20/20  Jaynee Eagles, PA-C    Allergies:   Allergies  Allergen Reactions   Amoxicillin Rash    Social History:  reports that he has never smoked. He has never used smokeless tobacco. He reports that he does not drink alcohol and does not use drugs.  Family History: Family History  Problem Relation Age of Onset   Hypertension Mother     Physical Exam: Vitals:   06/19/22 2128 06/19/22 2130 06/20/22 0225 06/20/22 0229  BP: 122/81 117/81 (!) 144/94   Pulse: (!) 107 98 93   Resp: 18 16 18    Temp: 98.4 F (36.9  C)     TempSrc: Oral     SpO2: 100% 99% 99% 100%  Weight: 70.3 kg     Height: 5' 10.5" (1.791 m)       General:  Alert and oriented times three, well developed, slender male.  NG tube in place.  Uncomfortable appearing patient Eyes: Right eye darkened sclera, sluggish like responsive, no  scleral icterus ENT: Moist oral mucosa, neck supple, no thyromegaly Lungs: clear to ascultation, no wheeze, no crackles, no use of accessory muscles Cardiovascular: regular rate and rhythm, no regurgitation, no gallops, no murmurs. No carotid bruits, no JVD Abdomen: soft, positive BS, nonspecific tenderness to palpation, greatest in epigastric area GU: not examined Neuro: CN II - XII grossly intact, sensation intact Musculoskeletal: strength 5/5 all extremities, no clubbing, cyanosis or edema Skin: no rash, no subcutaneous crepitation, no decubitus Psych: appropriate patient   Labs on Admission:  Recent Labs    06/19/22 2144  NA 135  K 3.7  CL 103  CO2 25  GLUCOSE 114*  BUN 11  CREATININE 0.99  CALCIUM 8.7*   Recent Labs    06/19/22 2144  AST 43*  ALT 40  ALKPHOS 228*  BILITOT 0.8  PROT 7.8  ALBUMIN 3.7   Recent Labs    06/19/22 2144  LIPASE 40   Recent Labs    06/19/22 2144  WBC 4.7  HGB 13.2  HCT 41.3  MCV 74.0*  PLT 207     Micro Results: Recent Results (from the past 240 hour(s))  Resp panel by RT-PCR (RSV, Flu A&B, Covid) Anterior Nasal Swab     Status: None   Collection Time: 06/19/22 10:12 PM   Specimen: Anterior Nasal Swab  Result Value Ref Range Status   SARS Coronavirus 2 by RT PCR NEGATIVE NEGATIVE Final    Comment: (NOTE) SARS-CoV-2 target nucleic acids are NOT DETECTED.  The SARS-CoV-2 RNA is generally detectable in upper respiratory specimens during the acute phase of infection. The lowest concentration of SARS-CoV-2 viral copies this assay can detect is 138 copies/mL. A negative result does not preclude SARS-Cov-2 infection and should not be used as the sole basis for treatment or other patient management decisions. A negative result may occur with  improper specimen collection/handling, submission of specimen other than nasopharyngeal swab, presence of viral mutation(s) within the areas targeted by this assay, and inadequate number  of viral copies(<138 copies/mL). A negative result must be combined with clinical observations, patient history, and epidemiological information. The expected result is Negative.  Fact Sheet for Patients:  EntrepreneurPulse.com.au  Fact Sheet for Healthcare Providers:  IncredibleEmployment.be  This test is no t yet approved or cleared by the [place] FDA and  has been authorized for detection and/or diagnosis of SARS-CoV-2 by FDA under an Emergency Use Authorization (EUA). This EUA will remain  in effect (meaning this test can be used) for the duration of the COVID-19 declaration under Section 564(b)(1) of the Act, 21 U.S.C.section 360bbb-3(b)(1), unless the authorization is terminated  or revoked sooner.       Influenza A by PCR NEGATIVE NEGATIVE Final   Influenza B by PCR NEGATIVE NEGATIVE Final    Comment: (NOTE) The Xpert Xpress SARS-CoV-2/FLU/RSV plus assay is intended as an aid in the diagnosis of influenza from Nasopharyngeal swab specimens and should not be used as a sole basis for treatment. Nasal washings and aspirates are unacceptable for Xpert Xpress SARS-CoV-2/FLU/RSV testing.  Fact Sheet for Patients: EntrepreneurPulse.com.au  Fact Sheet for Healthcare Providers:  IncredibleEmployment.be  This test is not yet approved or cleared by the [place] and has been authorized for detection and/or diagnosis of SARS-CoV-2 by FDA under an Emergency Use Authorization (EUA). This EUA will remain in effect (meaning this test can be used) for the duration of the COVID-19 declaration under Section 564(b)(1) of the Act, 21 U.S.C. section 360bbb-3(b)(1), unless the authorization is terminated or revoked.     Resp Syncytial Virus by PCR NEGATIVE NEGATIVE Final    Comment: (NOTE) Fact Sheet for Patients: EntrepreneurPulse.com.au  Fact Sheet for Healthcare  Providers: IncredibleEmployment.be  This test is not yet approved or cleared by the [place] FDA and has been authorized for detection and/or diagnosis of SARS-CoV-2 by FDA under an Emergency Use Authorization (EUA). This EUA will remain in effect (meaning this test can be used) for the duration of the COVID-19 declaration under Section 564(b)(1) of the Act, 21 U.S.C. section 360bbb-3(b)(1), unless the authorization is terminated or revoked.  Performed at [facility], [place] [address] [place], [place] [address]      Radiological Exams on Admission: DG Abd Portable 1 View  Result Date: 06/20/2022 CLINICAL DATA:  NG tube placement EXAM: PORTABLE ABDOMEN - 1 VIEW COMPARISON:  None Available. FINDINGS: NG tube tip is in the mid stomach. Nonobstructive bowel gas pattern. IMPRESSION: NG tube tip in the mid stomach. Electronically Signed   By: Rolm Baptise M.D.   On: 06/20/2022 03:24   CT Abdomen Pelvis W Contrast  Result Date: 06/19/2022 CLINICAL DATA:  Abdominal pain. EXAM: CT ABDOMEN AND PELVIS WITH CONTRAST TECHNIQUE: Multidetector CT imaging of the abdomen and pelvis was performed using the standard protocol following bolus administration of intravenous contrast. RADIATION DOSE REDUCTION: This exam was performed according to the departmental dose-optimization program which includes automated exposure control, adjustment of the mA and/or kV according to patient size and/or use of iterative reconstruction technique. CONTRAST:  122mL OMNIPAQUE IOHEXOL 300 MG/ML  SOLN COMPARISON:  None Available. FINDINGS: Lower chest: No acute abnormality. Hepatobiliary: No focal liver abnormality is seen. No gallstones, gallbladder wall thickening, or biliary dilatation. Pancreas: Unremarkable. No pancreatic ductal dilatation or surrounding inflammatory changes. Spleen: Normal in size without focal abnormality. Adrenals/Urinary Tract: Adrenal glands are unremarkable.  Kidneys are normal, without renal calculi, focal lesion, or hydronephrosis. The urinary bladder is poorly distended and subsequently limited in evaluation Stomach/Bowel: Stomach is moderately distended and contains a large amount of heterogeneous ingested material the duodenal bulb is also prominent. Appendix appears normal. No evidence of bowel wall thickening, distention, or inflammatory changes. Vascular/Lymphatic: No significant vascular findings are present. There is mild to moderate severity bilateral inguinal lymphadenopathy. Bilateral subcentimeter cervical chain lymph nodes are also seen. Reproductive: Prostate is unremarkable. Other: No abdominal wall hernia or abnormality. No abdominopelvic ascites. Musculoskeletal: No acute or significant osseous findings. IMPRESSION: 1. Large amount of heterogeneous ingested material within a distended stomach which may represent sequelae associated with gastric outlet obstruction. 2. Mild to moderate severity bilateral inguinal lymphadenopathy, likely reactive in nature. Electronically Signed   By: Virgina Norfolk M.D.   On: 06/19/2022 23:39    Assessment/Plan Present on Admission: Gastric outlet obstruction/malnutrition -N.p.o., IV fluids and pain medication -Continue NGT to low wall intermittent suction -GI consult placed by EDP via epic chat. ERCP vs MRI abdomen -IV Protonix twice daily.  Stomach ulcers, possible etiology -CEA, CA 19-9, alpha-fetoprotein, PSA ordered.  Concern for malignancy -Lipase level normal at 40.  Chronic pancreatitis possible etiology -PRN pain medications -  Nutrition consult for nutritional therapy.  Iritis, right eye  -Patient does not know the name of his eyedrop.  Family to bring. Pharmacist also contacted, they will try to obtain the name of the correct eye drop in the AM -ESR, CRP ordered  Lakeasha Petion 06/20/2022, 4:05 AM

## 2022-06-20 NOTE — Hospital Course (Signed)
Mr. Goulder is a 27 y.o. M with hx iritis, Bell's palsy who presented with few months weight loss of 50 lbs and now acute abdominal pain and vomiting.  This is the third episode of the same in 2 months (1/5 and 1/29).  In the ER, CT showed reactive inguinal lymphadenopathy and gastric distension with possible outlet obstruction.  NG placed and admitted and GI consulted.

## 2022-06-20 NOTE — Telephone Encounter (Signed)
LVM for patient to call back [phone] to schedule PCP apt. Also sending mychart msg to patient. AS, CMA

## 2022-06-20 NOTE — ED Notes (Signed)
ED TO INPATIENT HANDOFF REPORT  ED Nurse Name and Phone #: [phone] Orbie Hurst Name/Age/Gender Delma Freeze 27 y.o. male Room/Bed: WA04/WA04  Code Status   Code Status: Not on file  Home/SNF/Other Home Patient oriented to: self, place, time, and situation Is this baseline? Yes   Triage Complete: Triage complete  Chief Complaint Gastric outlet obstruction [K31.1]  Triage Note Pt came in with c/o stomach pain. Pt has been feeling bad for a few days but it got worse today.    Allergies Allergies  Allergen Reactions   Amoxicillin Rash    Level of Care/Admitting Diagnosis ED Disposition     ED Disposition  Admit   Condition  --   Comment  Hospital Area: [place] [[id number]]  Level of Care: Med-Surg [16]  May admit patient to [facility] or [facility] if equivalent level of care is available:: Yes  Covid Evaluation: Confirmed COVID Negative  Diagnosis: Gastric outlet obstruction [id number]  Admitting Physician: Kiana, Troutman  Attending Physician: Quintella Baton [id number]  Certification:: I certify this patient will need inpatient services for at least 2 midnights  Estimated Length of Stay: 2          B Medical/Surgery History Past Medical History:  Diagnosis Date   Pneumonia    Past Surgical History:  Procedure Laterality Date   APPENDECTOMY       A IV Location/Drains/Wounds Patient Lines/Drains/Airways Status     Active Line/Drains/Airways     Name Placement date Placement time Site Days   Peripheral IV 06/19/22 20 G Right Antecubital 06/19/22  2245  Antecubital  1   NG/OG Vented/Dual Lumen 16 Fr. Right nare Marking at nare/corner of mouth 55 cm 06/20/22  0308  Right nare  less than 1            Intake/Output Last 24 hours No intake or output data in the 24 hours ending 06/20/22 [id number]  Labs/Imaging Results for orders placed or performed during the hospital encounter of 06/19/22 (from the past 48 hour(s))  Urinalysis,  Routine w reflex microscopic -Urine, Clean Catch     Status: None   Collection Time: 06/19/22  9:28 PM  Result Value Ref Range   Color, Urine YELLOW YELLOW   APPearance CLEAR CLEAR   Specific Gravity, Urine 1.019 1.005 - 1.030   pH 5.0 5.0 - 8.0   Glucose, UA NEGATIVE NEGATIVE mg/dL   Hgb urine dipstick NEGATIVE NEGATIVE   Bilirubin Urine NEGATIVE NEGATIVE   Ketones, ur NEGATIVE NEGATIVE mg/dL   Protein, ur NEGATIVE NEGATIVE mg/dL   Nitrite NEGATIVE NEGATIVE   Leukocytes,Ua NEGATIVE NEGATIVE    Comment: Performed at [facility], [place] [address], [place], [place] [address]  Lipase, blood     Status: None   Collection Time: 06/19/22  9:44 PM  Result Value Ref Range   Lipase 40 11 - 51 U/L    Comment: Performed at [facility], [place] [address], [place], [place] [address]  Comprehensive metabolic panel     Status: Abnormal   Collection Time: 06/19/22  9:44 PM  Result Value Ref Range   Sodium 135 135 - 145 mmol/L   Potassium 3.7 3.5 - 5.1 mmol/L   Chloride 103 98 - 111 mmol/L   CO2 25 22 - 32 mmol/L   Glucose, Bld 114 (H) 70 - 99 mg/dL    Comment: Glucose reference range applies only to samples taken after fasting for at least 8 hours.  BUN 11 6 - 20 mg/dL   Creatinine, Ser 0.99 0.61 - 1.24 mg/dL   Calcium 8.7 (L) 8.9 - 10.3 mg/dL   Total Protein 7.8 6.5 - 8.1 g/dL   Albumin 3.7 3.5 - 5.0 g/dL   AST 43 (H) 15 - 41 U/L   ALT 40 0 - 44 U/L   Alkaline Phosphatase 228 (H) 38 - 126 U/L   Total Bilirubin 0.8 0.3 - 1.2 mg/dL   GFR, Estimated >60 >60 mL/min    Comment: (NOTE) Calculated using the CKD-EPI Creatinine Equation (2021)    Anion gap 7 5 - 15    Comment: Performed at [facility], [place] [address], [place], [place] [address]  CBC     Status: Abnormal   Collection Time: 06/19/22  9:44 PM  Result Value Ref Range   WBC 4.7 4.0 - 10.5 K/uL   RBC 5.58 4.22 - 5.81 MIL/uL   Hemoglobin 13.2 13.0 - 17.0 g/dL   HCT 41.3 39.0  - 52.0 %   MCV 74.0 (L) 80.0 - 100.0 fL   MCH 23.7 (L) 26.0 - 34.0 pg   MCHC 32.0 30.0 - 36.0 g/dL   RDW 14.5 11.5 - 15.5 %   Platelets 207 150 - 400 K/uL   nRBC 0.0 0.0 - 0.2 %    Comment: Performed at [facility], [place] [address], [place], [place] [address]  Resp panel by RT-PCR (RSV, Flu A&B, Covid) Anterior Nasal Swab     Status: None   Collection Time: 06/19/22 10:12 PM   Specimen: Anterior Nasal Swab  Result Value Ref Range   SARS Coronavirus 2 by RT PCR NEGATIVE NEGATIVE    Comment: (NOTE) SARS-CoV-2 target nucleic acids are NOT DETECTED.  The SARS-CoV-2 RNA is generally detectable in upper respiratory specimens during the acute phase of infection. The lowest concentration of SARS-CoV-2 viral copies this assay can detect is 138 copies/mL. A negative result does not preclude SARS-Cov-2 infection and should not be used as the sole basis for treatment or other patient management decisions. A negative result may occur with  improper specimen collection/handling, submission of specimen other than nasopharyngeal swab, presence of viral mutation(s) within the areas targeted by this assay, and inadequate number of viral copies(<138 copies/mL). A negative result must be combined with clinical observations, patient history, and epidemiological information. The expected result is Negative.  Fact Sheet for Patients:  EntrepreneurPulse.com.au  Fact Sheet for Healthcare Providers:  IncredibleEmployment.be  This test is no t yet approved or cleared by the [place] FDA and  has been authorized for detection and/or diagnosis of SARS-CoV-2 by FDA under an Emergency Use Authorization (EUA). This EUA will remain  in effect (meaning this test can be used) for the duration of the COVID-19 declaration under Section 564(b)(1) of the Act, 21 U.S.C.section 360bbb-3(b)(1), unless the authorization is terminated  or revoked sooner.        Influenza A by PCR NEGATIVE NEGATIVE   Influenza B by PCR NEGATIVE NEGATIVE    Comment: (NOTE) The Xpert Xpress SARS-CoV-2/FLU/RSV plus assay is intended as an aid in the diagnosis of influenza from Nasopharyngeal swab specimens and should not be used as a sole basis for treatment. Nasal washings and aspirates are unacceptable for Xpert Xpress SARS-CoV-2/FLU/RSV testing.  Fact Sheet for Patients: EntrepreneurPulse.com.au  Fact Sheet for Healthcare Providers: IncredibleEmployment.be  This test is not yet approved or cleared by the [place] FDA and has been authorized for detection and/or diagnosis  of SARS-CoV-2 by FDA under an Emergency Use Authorization (EUA). This EUA will remain in effect (meaning this test can be used) for the duration of the COVID-19 declaration under Section 564(b)(1) of the Act, 21 U.S.C. section 360bbb-3(b)(1), unless the authorization is terminated or revoked.     Resp Syncytial Virus by PCR NEGATIVE NEGATIVE    Comment: (NOTE) Fact Sheet for Patients: EntrepreneurPulse.com.au  Fact Sheet for Healthcare Providers: IncredibleEmployment.be  This test is not yet approved or cleared by the [place] FDA and has been authorized for detection and/or diagnosis of SARS-CoV-2 by FDA under an Emergency Use Authorization (EUA). This EUA will remain in effect (meaning this test can be used) for the duration of the COVID-19 declaration under Section 564(b)(1) of the Act, 21 U.S.C. section 360bbb-3(b)(1), unless the authorization is terminated or revoked.  Performed at [facility], [place] [address], [place], [place] [address]   CBC with Differential/Platelet     Status: Abnormal   Collection Time: 06/20/22  4:11 AM  Result Value Ref Range   WBC 5.8 4.0 - 10.5 K/uL   RBC 5.43 4.22 - 5.81 MIL/uL   Hemoglobin 13.0 13.0 - 17.0 g/dL   HCT 40.3 39.0 - 52.0 %   MCV  74.2 (L) 80.0 - 100.0 fL   MCH 23.9 (L) 26.0 - 34.0 pg   MCHC 32.3 30.0 - 36.0 g/dL   RDW 14.4 11.5 - 15.5 %   Platelets 188 150 - 400 K/uL   nRBC 0.0 0.0 - 0.2 %   Neutrophils Relative % 67 %   Neutro Abs 3.9 1.7 - 7.7 K/uL   Lymphocytes Relative 11 %   Lymphs Abs 0.6 (L) 0.7 - 4.0 K/uL   Monocytes Relative 13 %   Monocytes Absolute 0.8 0.1 - 1.0 K/uL   Eosinophils Relative 8 %   Eosinophils Absolute 0.5 0.0 - 0.5 K/uL   Basophils Relative 1 %   Basophils Absolute 0.1 0.0 - 0.1 K/uL   Immature Granulocytes 0 %   Abs Immature Granulocytes 0.01 0.00 - 0.07 K/uL    Comment: Performed at [facility], [place] [address], [place],  [address]   DG Abd Portable 1 View  Result Date: 06/20/2022 CLINICAL DATA:  NG tube placement EXAM: PORTABLE ABDOMEN - 1 VIEW COMPARISON:  None Available. FINDINGS: NG tube tip is in the mid stomach. Nonobstructive bowel gas pattern. IMPRESSION: NG tube tip in the mid stomach. Electronically Signed   By: Rolm Baptise M.D.   On: 06/20/2022 03:24   CT Abdomen Pelvis W Contrast  Result Date: 06/19/2022 CLINICAL DATA:  Abdominal pain. EXAM: CT ABDOMEN AND PELVIS WITH CONTRAST TECHNIQUE: Multidetector CT imaging of the abdomen and pelvis was performed using the standard protocol following bolus administration of intravenous contrast. RADIATION DOSE REDUCTION: This exam was performed according to the departmental dose-optimization program which includes automated exposure control, adjustment of the mA and/or kV according to patient size and/or use of iterative reconstruction technique. CONTRAST:  167mL OMNIPAQUE IOHEXOL 300 MG/ML  SOLN COMPARISON:  None Available. FINDINGS: Lower chest: No acute abnormality. Hepatobiliary: No focal liver abnormality is seen. No gallstones, gallbladder wall thickening, or biliary dilatation. Pancreas: Unremarkable. No pancreatic ductal dilatation or surrounding inflammatory changes. Spleen: Normal in size without focal  abnormality. Adrenals/Urinary Tract: Adrenal glands are unremarkable. Kidneys are normal, without renal calculi, focal lesion, or hydronephrosis. The urinary bladder is poorly distended and subsequently limited in evaluation Stomach/Bowel: Stomach is moderately distended and contains a  large amount of heterogeneous ingested material the duodenal bulb is also prominent. Appendix appears normal. No evidence of bowel wall thickening, distention, or inflammatory changes. Vascular/Lymphatic: No significant vascular findings are present. There is mild to moderate severity bilateral inguinal lymphadenopathy. Bilateral subcentimeter cervical chain lymph nodes are also seen. Reproductive: Prostate is unremarkable. Other: No abdominal wall hernia or abnormality. No abdominopelvic ascites. Musculoskeletal: No acute or significant osseous findings. IMPRESSION: 1. Large amount of heterogeneous ingested material within a distended stomach which may represent sequelae associated with gastric outlet obstruction. 2. Mild to moderate severity bilateral inguinal lymphadenopathy, likely reactive in nature. Electronically Signed   By: Virgina Norfolk M.D.   On: 06/19/2022 23:39    Pending Labs Unresulted Labs (From admission, onward)     Start     Ordered   06/20/22 0500  Comprehensive metabolic panel  Tomorrow morning,   R        06/20/22 0404   06/20/22 0404  Sedimentation rate  Once,   URGENT        06/20/22 0403   06/20/22 0404  C-reactive protein  Once,   URGENT        06/20/22 0403            Vitals/Pain Today's Vitals   06/19/22 2130 06/19/22 2134 06/20/22 0225 06/20/22 0229  BP: 117/81  (!) 144/94   Pulse: 98  93   Resp: 16  18   Temp:      TempSrc:      SpO2: 99%  99% 100%  Weight:      Height:      PainSc:  10-Worst pain ever      Isolation Precautions No active isolations  Medications Medications  ondansetron (ZOFRAN-ODT) disintegrating tablet 4 mg (has no administration in time range)   lidocaine (XYLOCAINE) 2 % (with pres) injection (  Not Given 06/20/22 0250)  morphine (PF) 2 MG/ML injection 1 mg (has no administration in time range)  0.9 % NaCl with KCl 20 mEq/ L  infusion ( Intravenous New Bag/Given 06/20/22 0426)  dicyclomine (BENTYL) capsule 10 mg (10 mg Oral Given 06/19/22 2219)  iohexol (OMNIPAQUE) 300 MG/ML solution 100 mL (100 mLs Intravenous Contrast Given 06/19/22 2324)  LORazepam (ATIVAN) injection 1 mg (1 mg Intravenous Given 06/20/22 0226)  lidocaine "Nebulized" 5 mL (5 mLs Nebulization Given 06/20/22 0229)  HYDROmorphone (DILAUDID) injection 1 mg (1 mg Intravenous Given 06/20/22 0426)    Mobility walks     Focused Assessments    R Recommendations: See Admitting Provider Note  Report given to:   Additional Notes:

## 2022-06-20 NOTE — Hospital Course (Signed)
GOO NG>remove?

## 2022-06-21 ENCOUNTER — Inpatient Hospital Stay (HOSPITAL_COMMUNITY): Payer: Medicaid Other | Admitting: Anesthesiology

## 2022-06-21 ENCOUNTER — Encounter (HOSPITAL_COMMUNITY): Payer: Self-pay | Admitting: Family Medicine

## 2022-06-21 ENCOUNTER — Encounter (HOSPITAL_COMMUNITY)
Admission: EM | Disposition: A | Discharge status: Home / Self Care | Payer: Self-pay | Source: Home / Self Care | Attending: Family Medicine

## 2022-06-21 DIAGNOSIS — K311 Adult hypertrophic pyloric stenosis: Secondary | ICD-10-CM | POA: Diagnosis not present

## 2022-06-21 DIAGNOSIS — R634 Abnormal weight loss: Secondary | ICD-10-CM | POA: Diagnosis not present

## 2022-06-21 DIAGNOSIS — R112 Nausea with vomiting, unspecified: Secondary | ICD-10-CM | POA: Diagnosis not present

## 2022-06-21 DIAGNOSIS — R933 Abnormal findings on diagnostic imaging of other parts of digestive tract: Secondary | ICD-10-CM | POA: Diagnosis not present

## 2022-06-21 HISTORY — PX: ESOPHAGOGASTRODUODENOSCOPY (EGD) WITH PROPOFOL: SHX5813

## 2022-06-21 LAB — BASIC METABOLIC PANEL
Anion gap: 9 (ref 5–15)
BUN: 13 mg/dL (ref 6–20)
CO2: 24 mmol/L (ref 22–32)
Calcium: 9 mg/dL (ref 8.9–10.3)
Chloride: 103 mmol/L (ref 98–111)
Creatinine, Ser: 1.04 mg/dL (ref 0.61–1.24)
GFR, Estimated: >60 mL/min (ref >60)
Glucose, Bld: 74 mg/dL (ref 70–99)
Potassium: 4 mmol/L (ref 3.5–5.1)
Sodium: 136 mmol/L (ref 135–145)

## 2022-06-21 LAB — IRON AND TIBC
Iron: 90 ug/dL (ref 45–182)
Saturation Ratios: 24 % (ref 17.9–39.5)
TIBC: 379 ug/dL (ref 250–450)
UIBC: 289 ug/dL

## 2022-06-21 LAB — FERRITIN: Ferritin: 94 ng/mL (ref 24–336)

## 2022-06-21 SURGERY — ESOPHAGOGASTRODUODENOSCOPY (EGD) WITH PROPOFOL
Anesthesia: General

## 2022-06-21 MED ORDER — ONDANSETRON HCL 4 MG/2ML IJ SOLN
INTRAMUSCULAR | Status: DC | PRN
  Administered 2022-06-21: 4 mg via INTRAVENOUS

## 2022-06-21 MED ORDER — LACTATED RINGERS IV SOLN
INTRAVENOUS | Status: AC | PRN
  Administered 2022-06-21: 1000 mL via INTRAVENOUS

## 2022-06-21 MED ORDER — ONDANSETRON HCL 4 MG PO TABS: 4.0000 mg | ORAL_TABLET | Freq: Every day | ORAL | 1 refills | Status: DC | PRN

## 2022-06-21 MED ORDER — PROPOFOL 10 MG/ML IV BOLUS
INTRAVENOUS | Status: DC | PRN
  Administered 2022-06-21: 200 mg via INTRAVENOUS

## 2022-06-21 MED ORDER — LIDOCAINE 2% (20 MG/ML) 5 ML SYRINGE
INTRAMUSCULAR | Status: DC | PRN
  Administered 2022-06-21: 100 mg via INTRAVENOUS

## 2022-06-21 MED ORDER — SUCCINYLCHOLINE CHLORIDE 200 MG/10ML IV SOSY
PREFILLED_SYRINGE | INTRAVENOUS | Status: DC | PRN
  Administered 2022-06-21: 120 mg via INTRAVENOUS

## 2022-06-21 MED ORDER — SODIUM CHLORIDE 0.9 % IV SOLN: INTRAVENOUS | Status: DC

## 2022-06-21 SURGICAL SUPPLY — 15 items

## 2022-06-21 NOTE — Op Note (Signed)
[facility] Patient Name: Jimmy Powers Procedure Date: 06/21/2022 MRN: [id number] Attending MD: Carol Ada , MD, [id number] Date of Birth: 10-05-1995 CSN: [id number] Age: 27 Admit Type: Inpatient Procedure:                Upper GI endoscopy Indications:              Abnormal CT of the GI tract, Nausea with vomiting Providers:                Carol Ada, MD, William Dalton, Technician,                            Vista Lawman, RN Referring MD:              Medicines:                Propofol per Anesthesia Complications:            No immediate complications. Estimated Blood Loss:     Estimated blood loss: none. Procedure:                Pre-Anesthesia Assessment:                           - Prior to the procedure, a History and Physical                            was performed, and patient medications and                            allergies were reviewed. The patient's tolerance of                            previous anesthesia was also reviewed. The risks                            and benefits of the procedure and the sedation                            options and risks were discussed with the patient.                            All questions were answered, and informed consent                            was obtained. Prior Anticoagulants: The patient has                            taken no anticoagulant or antiplatelet agents. ASA                            Grade Assessment: II - A patient with mild systemic                            disease. After reviewing the risks and benefits,  the patient was deemed in satisfactory condition to                            undergo the procedure.                           - Sedation was administered by an anesthesia                            professional. Deep sedation was attained.                           After obtaining informed consent, the endoscope was                            passed  under direct vision. Throughout the                            procedure, the patient's blood pressure, pulse, and                            oxygen saturations were monitored continuously. The                            [id number] Olympus endoscope was introduced                            through the mouth, and advanced to the second part                            of duodenum. The upper GI endoscopy was                            accomplished without difficulty. The patient                            tolerated the procedure well. Scope In: Scope Out: Findings:      The esophagus was normal. The image of the GE junction was inadequate.      The stomach was normal.      The examined duodenum was normal. Impression:               - Normal esophagus.                           - Normal stomach.                           - Normal examined duodenum.                           - No specimens collected. Moderate Sedation:      Not Applicable - Patient had care per Anesthesia. Recommendation:           - Return patient to hospital ward for ongoing care.                           -  Resume regular diet.                           - Continue present medications.                           - Gastric emptying scan. Procedure Code(s):        --- Professional ---                           [phone], Esophagogastroduodenoscopy, flexible,                            transoral; diagnostic, including collection of                            specimen(s) by brushing or washing, when performed                            (separate procedure) Diagnosis Code(s):        --- Professional ---                           R93.3, Abnormal findings on diagnostic imaging of                            other parts of digestive tract                           R11.2, Nausea with vomiting, unspecified CPT copyright 2022 American Medical Association. All rights reserved. The codes documented in this report are preliminary  and upon coder review may  be revised to meet current compliance requirements. Carol Ada, MD Carol Ada, MD 06/21/2022 12:10:45 PM This report has been signed electronically. Number of Addenda: 0

## 2022-06-21 NOTE — Anesthesia Procedure Notes (Signed)
Date/Time: 06/21/2022 12:14 PM  Performed by: Cynda Familia, CRNAOxygen Delivery Method: Simple face mask Placement Confirmation: positive ETCO2 and breath sounds checked- equal and bilateral Dental Injury: Teeth and Oropharynx as per pre-operative assessment

## 2022-06-21 NOTE — Anesthesia Postprocedure Evaluation (Signed)
Anesthesia Post Note  Patient: Errol Kresge  Procedure(s) Performed: ESOPHAGOGASTRODUODENOSCOPY (EGD) WITH PROPOFOL     Patient location during evaluation: PACU Anesthesia Type: General Level of consciousness: awake and alert and oriented Pain management: pain level controlled Vital Signs Assessment: post-procedure vital signs reviewed and stable Respiratory status: spontaneous breathing, nonlabored ventilation and respiratory function stable Cardiovascular status: blood pressure returned to baseline and stable Postop Assessment: no apparent nausea or vomiting Anesthetic complications: no   No notable events documented.  Last Vitals:  Vitals:   06/21/22 1225 06/21/22 1230  BP: 130/64 123/71  Pulse: (!) 111 (!) 104  Resp: (!) 22 12  Temp:    SpO2: 95% 92%    Last Pain:  Vitals:   06/21/22 1230  TempSrc:   PainSc: (P) 0-No pain                 Kalief Kattner A.

## 2022-06-21 NOTE — Plan of Care (Signed)
  Problem: Health Behavior/Discharge Planning: Goal: Ability to manage health-related needs will improve Outcome: Progressing   Problem: Clinical Measurements: Goal: Ability to maintain clinical measurements within normal limits will improve Outcome: Progressing   Problem: Clinical Measurements: Goal: Will remain free from infection Outcome: Progressing   

## 2022-06-21 NOTE — Transfer of Care (Signed)
Immediate Anesthesia Transfer of Care Note  Patient: Jimmy Powers  Procedure(s) Performed: ESOPHAGOGASTRODUODENOSCOPY (EGD) WITH PROPOFOL  Patient Location: PACU and Endoscopy Unit  Anesthesia Type:MAC  Level of Consciousness: awake  Airway & Oxygen Therapy: Patient Spontanous Breathing and Patient connected to face mask oxygen  Post-op Assessment: Report given to RN and Post -op Vital signs reviewed and stable  Post vital signs: Reviewed and stable  Last Vitals:  Vitals Value Taken Time  BP 130/64 06/21/22 1222  Temp    Pulse 111 06/21/22 1225  Resp 22 06/21/22 1225  SpO2 95 % 06/21/22 1225  Vitals shown include unvalidated device data.  Last Pain:  Vitals:   06/21/22 1027  TempSrc: Temporal  PainSc: 0-No pain         Complications: No notable events documented.

## 2022-06-21 NOTE — Interval H&P Note (Signed)
History and Physical Interval Note:  06/21/2022 11:00 AM  Jimmy Powers  has presented today for surgery, with the diagnosis of Gastric outlet obstruction.  The various methods of treatment have been discussed with the patient and family. After consideration of risks, benefits and other options for treatment, the patient has consented to  Procedure(s): ESOPHAGOGASTRODUODENOSCOPY (EGD) WITH PROPOFOL (N/A) as a surgical intervention.  The patient's history has been reviewed, patient examined, no change in status, stable for surgery.  I have reviewed the patient's chart and labs.  Questions were answered to the patient's satisfaction.     Sanav Remer D

## 2022-06-21 NOTE — Discharge Summary (Signed)
Physician Discharge Summary   Patient: Jimmy Powers MRN: [id number] DOB: 01-07-1996  Admit date:     06/19/2022  Discharge date: 06/21/22  Discharge Physician: Edwin Dada   PCP: Pcp, No     Recommendations at discharge:  Establish with new PCP  New PCP: Please check iron levels in 1-2 months If patient has another episode of vomiting and abdominal pain, please obtain gastric emptying study or refer to Dr. Benson Norway GI Please follow up CEA, CA 19-9, and gastrin levels Follow up HIV, RPR, and GC/chlamydia If STD testing negative, repeat CT abdomen/pelvis with contrast in 3-6 months to evaluate inguinal lymphadenopathy      Discharge Diagnoses: Principal Problem:   Gastroenteritis Active Problems:   Iritis   Gastric outlet obstruction ruled out   Unintentional weight loss   Microcytic anemia   Inguinal lymphadenopathy     Hospital Course: Jimmy Powers is a 27 y.o. M with hx iritis, Bell's palsy who presented with few months weight loss of 50 lbs and now acute abdominal pain and vomiting.  This is the third episode of the same in 2 months (1/5 and 1/29).  In the ER, CT showed reactive inguinal lymphadenopathy and gastric distension with possible outlet obstruction.  NG placed and admitted and GI consulted.      Abdominal pain and vomiting due to suspected gastroenteritis Patient was admitted and given antiemetics.  He had no output from NG tube and so this was removed and he was asymptomatic.  He underwent EGD with Dr. Benson Norway, and this was normal.  He was given diet which he tolerated well.    Unintentional weight loss Patient has 10 kg weight loss in last 2 months. EGD unremarkable.  Blood work normal, PSA normal.  CEA, CA 19-9 and gastrin pending.  If those are normal, I suspect this was from either recurrent gastritis/gastroenteritis or possibly gastroparesis.  Nothing was found in his blood work or imaging which could explain  it.   Lymphadenopathy Incidental finding of inguinal lymphednopatphy.  PSA normal.  No urinary symptoms, no penile discharge.  Not sure how this relates to the presenting complaint unless he had an enteritis that caused some inguinal LA.  I would recommend repeat CT in 3-4 months to document resolution.    Microcytic anemia Long-standing microcytosis, likely thalassemia.  Iron studies normal         [facility] Controlled Substances Registry was reviewed for this patient prior to discharge.   Consultants: GI, Dr. Benson Norway Procedures performed: EGD, CT abdomen and pelvis  Disposition: Home   DISCHARGE MEDICATION: Allergies as of 06/21/2022       Reactions   Amoxicillin Rash        Medication List     TAKE these medications    Difluprednate 0.05 % Emul Place 1 drop into both eyes daily.   ondansetron 4 MG tablet Commonly known as: Zofran Take 1 tablet (4 mg total) by mouth daily as needed for nausea or vomiting.   trimethoprim-polymyxin b ophthalmic solution Commonly known as: POLYTRIM Place 1 drop into both eyes every 6 (six) hours.        Follow-up Information     New primary care physician. Schedule an appointment as soon as possible for a visit in 2 week(s).          Carol Ada, MD Follow up.   Specialty: Gastroenterology Contact information: [address] [place] [address] [phone]  Discharge Instructions     Discharge instructions   Complete by: As directed    **IMPORTANT DISCHARGE INSTRUCTIONS**   From Dr. Loleta Books: You were admitted for abdominal pain and vomiting.  You had a CT scan that showed retained contents in the stomach and possibly an "obstruction" of the outlet of the stomach (blockage of the place where the stomach empties)  You had an endoscopy (an "EGD") where Dr. Benson Norway (a gastroenterology specialist) was able to look with a camera into the stomach, and what he found,  thankfully, was that there was no obstruction, and everything looked good.   I have written you a prescription for a medicine called ondansetron or "Zofran".  You can take it as needed if you have another episode of nausea, but hopefully you won't  IMPORTANT: Go see your new primary doctor in 1-2 weeks They should check your blood level and iron levels in 1-2 months  If the blood tests I ordered before you left are all normal, your new primary care should repeat a CAT scan of your abdomen in 3-4 months  Have your new primary care doctor (PCP) check your weight and compare with current weight to see if you are gaining or losing  If you are having further vomiting episodes like this, and the above studies are normal, your new primary care will need to order a gastric emptying study You don't have to follow up with Dr. Benson Norway the gastroenterology specialist if you don't need to, but his number is below  Watch mychart for any of your tests that are still being processed at the time of discharge If any are abnormal, I will call you If you don't see them, call the hospitalist office at [phone] and let them know you have a question about your tests and ask they can reach out to me to call you back   Increase activity slowly   Complete by: As directed        Discharge Exam: Filed Weights   06/19/22 2128  Weight: 70.3 kg    General: Pt is alert, awake, not in acute distress Cardiovascular: RRR, nl S1-S2, no murmurs appreciated.   No LE edema.   Respiratory: Normal respiratory rate and rhythm.  CTAB without rales or wheezes. Abdominal: Abdomen soft and non-tender.  No distension or HSM.   Neuro/Psych: Strength symmetric in upper and lower extremities.  Judgment and insight appear normal.   Condition at discharge: good  The results of significant diagnostics from this hospitalization (including imaging, microbiology, ancillary and laboratory) are listed below for reference.    Imaging Studies: DG Abd Portable 1 View  Result Date: 06/20/2022 CLINICAL DATA:  NG tube placement EXAM: PORTABLE ABDOMEN - 1 VIEW COMPARISON:  None Available. FINDINGS: NG tube tip is in the mid stomach. Nonobstructive bowel gas pattern. IMPRESSION: NG tube tip in the mid stomach. Electronically Signed   By: Rolm Baptise M.D.   On: 06/20/2022 03:24   CT Abdomen Pelvis W Contrast  Result Date: 06/19/2022 CLINICAL DATA:  Abdominal pain. EXAM: CT ABDOMEN AND PELVIS WITH CONTRAST TECHNIQUE: Multidetector CT imaging of the abdomen and pelvis was performed using the standard protocol following bolus administration of intravenous contrast. RADIATION DOSE REDUCTION: This exam was performed according to the departmental dose-optimization program which includes automated exposure control, adjustment of the mA and/or kV according to patient size and/or use of iterative reconstruction technique. CONTRAST:  110mL OMNIPAQUE IOHEXOL 300 MG/ML  SOLN COMPARISON:  None Available. FINDINGS: Lower chest:  No acute abnormality. Hepatobiliary: No focal liver abnormality is seen. No gallstones, gallbladder wall thickening, or biliary dilatation. Pancreas: Unremarkable. No pancreatic ductal dilatation or surrounding inflammatory changes. Spleen: Normal in size without focal abnormality. Adrenals/Urinary Tract: Adrenal glands are unremarkable. Kidneys are normal, without renal calculi, focal lesion, or hydronephrosis. The urinary bladder is poorly distended and subsequently limited in evaluation Stomach/Bowel: Stomach is moderately distended and contains a large amount of heterogeneous ingested material the duodenal bulb is also prominent. Appendix appears normal. No evidence of bowel wall thickening, distention, or inflammatory changes. Vascular/Lymphatic: No significant vascular findings are present. There is mild to moderate severity bilateral inguinal lymphadenopathy. Bilateral subcentimeter cervical chain lymph nodes are  also seen. Reproductive: Prostate is unremarkable. Other: No abdominal wall hernia or abnormality. No abdominopelvic ascites. Musculoskeletal: No acute or significant osseous findings. IMPRESSION: 1. Large amount of heterogeneous ingested material within a distended stomach which may represent sequelae associated with gastric outlet obstruction. 2. Mild to moderate severity bilateral inguinal lymphadenopathy, likely reactive in nature. Electronically Signed   By: Virgina Norfolk M.D.   On: 06/19/2022 23:39    Microbiology: Results for orders placed or performed during the hospital encounter of 06/19/22  Resp panel by RT-PCR (RSV, Flu A&B, Covid) Anterior Nasal Swab     Status: None   Collection Time: 06/19/22 10:12 PM   Specimen: Anterior Nasal Swab  Result Value Ref Range Status   SARS Coronavirus 2 by RT PCR NEGATIVE NEGATIVE Final    Comment: (NOTE) SARS-CoV-2 target nucleic acids are NOT DETECTED.  The SARS-CoV-2 RNA is generally detectable in upper respiratory specimens during the acute phase of infection. The lowest concentration of SARS-CoV-2 viral copies this assay can detect is 138 copies/mL. A negative result does not preclude SARS-Cov-2 infection and should not be used as the sole basis for treatment or other patient management decisions. A negative result may occur with  improper specimen collection/handling, submission of specimen other than nasopharyngeal swab, presence of viral mutation(s) within the areas targeted by this assay, and inadequate number of viral copies(<138 copies/mL). A negative result must be combined with clinical observations, patient history, and epidemiological information. The expected result is Negative.  Fact Sheet for Patients:  EntrepreneurPulse.com.au  Fact Sheet for Healthcare Providers:  IncredibleEmployment.be  This test is no t yet approved or cleared by the [place] FDA and  has been authorized  for detection and/or diagnosis of SARS-CoV-2 by FDA under an Emergency Use Authorization (EUA). This EUA will remain  in effect (meaning this test can be used) for the duration of the COVID-19 declaration under Section 564(b)(1) of the Act, 21 U.S.C.section 360bbb-3(b)(1), unless the authorization is terminated  or revoked sooner.       Influenza A by PCR NEGATIVE NEGATIVE Final   Influenza B by PCR NEGATIVE NEGATIVE Final    Comment: (NOTE) The Xpert Xpress SARS-CoV-2/FLU/RSV plus assay is intended as an aid in the diagnosis of influenza from Nasopharyngeal swab specimens and should not be used as a sole basis for treatment. Nasal washings and aspirates are unacceptable for Xpert Xpress SARS-CoV-2/FLU/RSV testing.  Fact Sheet for Patients: EntrepreneurPulse.com.au  Fact Sheet for Healthcare Providers: IncredibleEmployment.be  This test is not yet approved or cleared by the [place] FDA and has been authorized for detection and/or diagnosis of SARS-CoV-2 by FDA under an Emergency Use Authorization (EUA). This EUA will remain in effect (meaning this test can be used) for the duration of the COVID-19 declaration under Section 564(b)(1) of the  Act, 21 U.S.C. section 360bbb-3(b)(1), unless the authorization is terminated or revoked.     Resp Syncytial Virus by PCR NEGATIVE NEGATIVE Final    Comment: (NOTE) Fact Sheet for Patients: EntrepreneurPulse.com.au  Fact Sheet for Healthcare Providers: IncredibleEmployment.be  This test is not yet approved or cleared by the [place] FDA and has been authorized for detection and/or diagnosis of SARS-CoV-2 by FDA under an Emergency Use Authorization (EUA). This EUA will remain in effect (meaning this test can be used) for the duration of the COVID-19 declaration under Section 564(b)(1) of the Act, 21 U.S.C. section 360bbb-3(b)(1), unless the authorization is  terminated or revoked.  Performed at [facility] [place] [address], [place], [place] [address]     Labs: CBC: Recent Labs  Lab 06/19/22 2144 06/20/22 0411  WBC 4.7 5.8  NEUTROABS  --  3.9  HGB 13.2 13.0  HCT 41.3 40.3  MCV 74.0* 74.2*  PLT 207 0000000   Basic Metabolic Panel: Recent Labs  Lab 06/19/22 2144 06/20/22 0411 06/20/22 0748 06/21/22 0512  NA 135 137 137 136  K 3.7 3.5 3.8 4.0  CL 103 101 104 103  CO2 25 26 26 24   GLUCOSE 114* 122* 121* 74  BUN 11 13 13 13   CREATININE 0.99 1.11 0.95 1.04  CALCIUM 8.7* 9.0 8.9 9.0   Liver Function Tests: Recent Labs  Lab 06/19/22 2144 06/20/22 0411 06/20/22 0748  AST 43* 39 36  ALT 40 42 40  ALKPHOS 228* 218* 223*  BILITOT 0.8 0.5 0.8  PROT 7.8 7.9 7.7  ALBUMIN 3.7 3.6 3.7   CBG: No results for input(s): "GLUCAP" in the last 168 hours.  Discharge time spent: approximately 35 minutes spent on discharge counseling, evaluation of patient on day of discharge, and coordination of discharge planning with nursing, social work, pharmacy and case management  Signed: Edwin Dada, MD [facility] 06/21/2022

## 2022-06-21 NOTE — Anesthesia Procedure Notes (Signed)
Procedure Name: Intubation Date/Time: 06/21/2022 11:54 AM  Performed by: Lind Covert, CRNAPre-anesthesia Checklist: Patient identified, Emergency Drugs available, Suction available, Patient being monitored and Timeout performed Patient Re-evaluated:Patient Re-evaluated prior to induction Oxygen Delivery Method: Circle system utilized Preoxygenation: Pre-oxygenation with 100% oxygen Induction Type: IV induction, Rapid sequence and Cricoid Pressure applied Laryngoscope Size: Mac and 4 Grade View: Grade I Tube type: Oral Tube size: 7.5 mm Number of attempts: 1 Airway Equipment and Method: Stylet Placement Confirmation: ETT inserted through vocal cords under direct vision, positive ETCO2 and breath sounds checked- equal and bilateral Secured at: 23 cm Tube secured with: Tape Dental Injury: Teeth and Oropharynx as per pre-operative assessment

## 2022-06-21 NOTE — Anesthesia Preprocedure Evaluation (Signed)
Anesthesia Evaluation  Patient identified by MRN, date of birth, ID band Patient awake    Reviewed: Allergy & Precautions, NPO status , Patient's Chart, lab work & pertinent test results  Airway Mallampati: II  TM Distance: >3 FB Neck ROM: Full    Dental  (+) Teeth Intact, Dental Advisory Given   Pulmonary pneumonia, resolved   Pulmonary exam normal breath sounds clear to auscultation       Cardiovascular negative cardio ROS  Rhythm:Regular Rate:Tachycardia     Neuro/Psych Hx/o iritis - on steroid eye gtts  negative psych ROS   GI/Hepatic Neg liver ROS,,,Gastric outlet obstruction   Endo/Other  negative endocrine ROS    Renal/GU negative Renal ROS  negative genitourinary   Musculoskeletal negative musculoskeletal ROS (+)    Abdominal   Peds  Hematology Bilateral inguinal lymphadenopathy   Anesthesia Other Findings   Reproductive/Obstetrics                              Anesthesia Physical Anesthesia Plan  ASA: 2  Anesthesia Plan: General   Post-op Pain Management: Minimal or no pain anticipated   Induction: Intravenous, Rapid sequence and Cricoid pressure planned  PONV Risk Score and Plan: 3 and Treatment may vary due to age or medical condition and Ondansetron  Airway Management Planned: Oral ETT  Additional Equipment: None  Intra-op Plan:   Post-operative Plan: Extubation in OR  Informed Consent: I have reviewed the patients History and Physical, chart, labs and discussed the procedure including the risks, benefits and alternatives for the proposed anesthesia with the patient or authorized representative who has indicated his/her understanding and acceptance.     Dental advisory given  Plan Discussed with: Anesthesiologist and CRNA  Anesthesia Plan Comments:          Anesthesia Quick Evaluation

## 2022-06-22 LAB — GASTRIN: Gastrin: 54 pg/mL (ref 0–115)

## 2022-06-22 LAB — RPR: RPR Ser Ql: NONREACTIVE

## 2022-06-22 LAB — CEA: CEA: 1.8 ng/mL (ref 0.0–4.7)

## 2022-06-22 LAB — AFP TUMOR MARKER: AFP, Serum, Tumor Marker: 2 ng/mL (ref 0.0–5.7)

## 2022-06-22 LAB — HIV ANTIBODY (ROUTINE TESTING W REFLEX): HIV Screen 4th Generation wRfx: NONREACTIVE

## 2022-06-22 LAB — CANCER ANTIGEN 19-9: CA 19-9: 8 U/mL (ref 0–35)

## 2022-06-24 LAB — GC/CHLAMYDIA PROBE AMP (~~LOC~~) NOT AT ARMC
Chlamydia: NEGATIVE
Comment: NEGATIVE
Comment: NORMAL
Neisseria Gonorrhea: NEGATIVE

## 2022-08-20 ENCOUNTER — Encounter (HOSPITAL_COMMUNITY): Payer: Self-pay

## 2022-08-20 ENCOUNTER — Ambulatory Visit (HOSPITAL_COMMUNITY)
Admission: EM | Admit: 2022-08-20 | Discharge: 2022-08-20 | Disposition: A | Discharge status: Home / Self Care | Payer: Medicaid Other | Attending: Family Medicine | Admitting: Family Medicine

## 2022-08-20 DIAGNOSIS — R1013 Epigastric pain: Secondary | ICD-10-CM

## 2022-08-20 NOTE — ED Provider Notes (Signed)
[facility]    CSN: [id number] Arrival date & time: 08/20/22  0102      History   Chief Complaint Chief Complaint  Patient presents with   Abdominal Pain    HPI Jimmy Powers is a 27 y.o. male.    Abdominal Pain  Here for abdominal pain and inability to have a bowel movement this morning.  He had been feeling fairly well and having normal bowel movements in the last few weeks.  No recent fever or nausea or vomiting.  No bleeding in the stool.  No cough or congestion  In late March she was admitted for intestinal ileus or obstruction tha improved without surgical intervention.  Pain currently is ranked 8 out of 10  Past Medical History:  Diagnosis Date   Pneumonia     Patient Active Problem List   Diagnosis Date Noted   Gastric outlet obstruction 06/20/2022   Iritis 06/20/2022    Past Surgical History:  Procedure Laterality Date   APPENDECTOMY     ESOPHAGOGASTRODUODENOSCOPY (EGD) WITH PROPOFOL N/A 06/21/2022   Procedure: ESOPHAGOGASTRODUODENOSCOPY (EGD) WITH PROPOFOL;  Surgeon: Jeani Hawking, MD;  Location: [facility];  Service: Gastroenterology;  Laterality: N/A;       Home Medications    Prior to Admission medications   Medication Sig Start Date End Date Taking? Authorizing Provider  cetirizine (ZYRTEC ALLERGY) 10 MG tablet Take 1 tablet (10 mg total) by mouth daily. 06/08/20 07/20/20  Wallis Bamberg, PA-C    Family History Family History  Problem Relation Age of Onset   Hypertension Mother     Social History Social History   Tobacco Use   Smoking status: Never   Smokeless tobacco: Never  Substance Use Topics   Alcohol use: No   Drug use: No     Allergies   Amoxicillin   Review of Systems Review of Systems  Gastrointestinal:  Positive for abdominal pain.     Physical Exam Triage Vital Signs ED Triage Vitals [08/20/22 0919]  Enc Vitals Group     BP 101/68     Pulse Rate 77     Resp 18     Temp 97.9 F (36.6 C)      Temp Source Oral     SpO2 95 %     Weight      Height      Head Circumference      Peak Flow      Pain Score 8     Pain Loc      Pain Edu?      Excl. in GC?    No data found.  Updated Vital Signs BP 101/68 (BP Location: Left Arm)   Pulse 77   Temp 97.9 F (36.6 C) (Oral)   Resp 18   SpO2 95%   Visual Acuity Right Eye Distance:   Left Eye Distance:   Bilateral Distance:    Right Eye Near:   Left Eye Near:    Bilateral Near:     Physical Exam Vitals reviewed.  Constitutional:      Appearance: He is not ill-appearing, toxic-appearing or diaphoretic.     Comments: Pained facies noted  HENT:     Mouth/Throat:     Mouth: Mucous membranes are moist.  Eyes:     Extraocular Movements: Extraocular movements intact.     Conjunctiva/sclera: Conjunctivae normal.     Pupils: Pupils are equal, round, and reactive to light.  Cardiovascular:     Rate and Rhythm: Normal  rate and regular rhythm.     Heart sounds: No murmur heard. Pulmonary:     Effort: Pulmonary effort is normal.     Breath sounds: Normal breath sounds.  Abdominal:     Comments: Hypoactive bowel sounds.  He is very tender in the upper quadrants and periumbilical area.  There is guarding.  Musculoskeletal:     Cervical back: Neck supple.  Lymphadenopathy:     Cervical: No cervical adenopathy.  Skin:    Coloration: Skin is not jaundiced or pale.  Neurological:     General: No focal deficit present.     Mental Status: He is alert and oriented to person, place, and time.  Psychiatric:        Behavior: Behavior normal.      UC Treatments / Results  Labs (all labs ordered are listed, but only abnormal results are displayed) Labs Reviewed - No data to display  EKG   Radiology No results found.  Procedures Procedures (including critical care time)  Medications Ordered in UC Medications - No data to display  Initial Impression / Assessment and Plan / UC Course  I have reviewed the triage vital  signs and the nursing notes.  Pertinent labs & imaging results that were available during my care of the patient were reviewed by me and considered in my medical decision making (see chart for details).        With the severity of his pain in his exquisite tenderness on exam, I am concerned that he may be developing obstruction again.  He will proceed to the emergency room for higher level of evaluation and treatment that we can provide in the urgent care setting. Final Clinical Impressions(s) / UC Diagnoses   Final diagnoses:  Epigastric pain     Discharge Instructions      Patient will proceed to the emergency room for further evaluation     ED Prescriptions   None    I have reviewed the PDMP during this encounter.   Zenia Resides, MD 08/20/22 [phone]

## 2022-08-20 NOTE — Discharge Instructions (Signed)
Patient will proceed to the emergency room for further evaluation. 

## 2022-08-20 NOTE — ED Triage Notes (Signed)
Patient having abdominal pain and constipation. No emesis or diarrhea. Onset this morning. No dietary or medication changes. No known sick exposure. Patient has history of bowel block in March of 2024.   Last BM was yesterday no straining.

## 2022-08-20 NOTE — ED Notes (Signed)
Patient is being discharged from the Urgent Care and sent to the Emergency Department via POV . Per Dr Marlinda Mike, patient is in need of higher level of care due to abdominal pain. Patient is aware and verbalizes understanding of plan of care.  Vitals:   08/20/22 0919  BP: 101/68  Pulse: 77  Resp: 18  Temp: 97.9 F (36.6 C)  SpO2: 95%

## 2022-09-15 ENCOUNTER — Other Ambulatory Visit: Payer: Self-pay | Admitting: Nurse Practitioner

## 2022-09-15 ENCOUNTER — Telehealth: Payer: Medicaid Other | Admitting: Nurse Practitioner

## 2022-09-15 DIAGNOSIS — H109 Unspecified conjunctivitis: Secondary | ICD-10-CM | POA: Diagnosis not present

## 2022-09-15 MED ORDER — POLYMYXIN B-TRIMETHOPRIM 10000-0.1 UNIT/ML-% OP SOLN: 1.0000 [drp] | OPHTHALMIC | 0 refills | Status: DC

## 2022-09-15 NOTE — Progress Notes (Signed)
Virtual Visit Consent   Jimmy Powers, you are scheduled for a virtual visit with a [facility] provider today. Just as with appointments in the office, your consent must be obtained to participate. Your consent will be active for this visit and any virtual visit you may have with one of our providers in the next 365 days. If you have a MyChart account, a copy of this consent can be sent to you electronically.  As this is a virtual visit, video technology does not allow for your provider to perform a traditional examination. This may limit your provider's ability to fully assess your condition. If your provider identifies any concerns that need to be evaluated in person or the need to arrange testing (such as labs, EKG, etc.), we will make arrangements to do so. Although advances in technology are sophisticated, we cannot ensure that it will always work on either your end or our end. If the connection with a video visit is poor, the visit may have to be switched to a telephone visit. With either a video or telephone visit, we are not always able to ensure that we have a secure connection.  By engaging in this virtual visit, you consent to the provision of healthcare and authorize for your insurance to be billed (if applicable) for the services provided during this visit. Depending on your insurance coverage, you may receive a charge related to this service.  I need to obtain your verbal consent now. Are you willing to proceed with your visit today? Jimmy Powers has provided verbal consent on 09/15/2022 for a virtual visit (video or telephone). Jimmy Rigg, NP  Date: 09/15/2022 4:06 PM  Virtual Visit via Video Note   I, Jimmy Powers, connected with  Jimmy Powers  ([id number], [birth year]) on 09/15/22 at  4:00 PM EDT by a video-enabled telemedicine application and verified that I am speaking with the correct person using two identifiers.  Location: Patient: Virtual Visit Location Patient:  Home Provider: Virtual Visit Location Provider: Home Office   I discussed the limitations of evaluation and management by telemedicine and the availability of in person appointments. The patient expressed understanding and agreed to proceed.    History of Present Illness: Jimmy Powers is a 27 y.o. who identifies as a male who was assigned male at birth, and is being seen today for bacterial conjunctivitis.  Jimmy Powers has concerns today of painful, irritated red eye with light sensitivity. States in the past he has been treated for eye infection and was given eye drops for this.    Problems:  Patient Active Problem List   Diagnosis Date Noted   Gastric outlet obstruction 06/20/2022   Iritis 06/20/2022    Allergies:  Allergies  Allergen Reactions   Amoxicillin Rash   Medications:  Current Outpatient Medications:    trimethoprim-polymyxin b (POLYTRIM) ophthalmic solution, Place 1 drop into the right eye every 4 (four) hours., Disp: 10 mL, Rfl: 0  Observations/Objective: Patient is well-developed, well-nourished in no acute distress.  Resting comfortably at home.  Head is normocephalic, atraumatic.  No labored breathing.  Speech is clear and coherent with logical content.  Patient is alert and oriented at baseline.  Difficulty assessing eyes due to poor lighting in home.   Assessment and Plan: 1. Bacterial conjunctivitis of left eye - trimethoprim-polymyxin b (POLYTRIM) ophthalmic solution; Place 1 drop into the right eye every 4 (four) hours.  Dispense: 10 mL; Refill: 0  Apply cold compresses to eye for comfort.  Follow Up Instructions: I discussed the assessment and treatment plan with the patient. The patient was provided an opportunity to ask questions and all were answered. The patient agreed with the plan and demonstrated an understanding of the instructions.  A copy of instructions were sent to the patient via MyChart unless otherwise noted below.    The patient was  advised to call back or seek an in-person evaluation if the symptoms worsen or if the condition fails to improve as anticipated.  Time:  I spent 11 minutes with the patient via telehealth technology discussing the above problems/concerns.    Jimmy Rigg, NP

## 2022-09-15 NOTE — Patient Instructions (Signed)
  Jimmy Powers, thank you for joining Claiborne Rigg, NP for today's virtual visit.  While this provider is not your primary care provider (PCP), if your PCP is located in our provider database this encounter information will be shared with them immediately following your visit.   A [facility] MyChart account gives you access to today's visit and all your visits, tests, and labs performed at [facility] " click here if you don't have a [facility] MyChart account or go to [email or web address]  Consent: (Patient) Jimmy Powers provided verbal consent for this virtual visit at the beginning of the encounter.  Current Medications:  Current Outpatient Medications:    trimethoprim-polymyxin b (POLYTRIM) ophthalmic solution, Place 1 drop into the right eye every 4 (four) hours., Disp: 10 mL, Rfl: 0   Medications ordered in this encounter:  Meds ordered this encounter  Medications   trimethoprim-polymyxin b (POLYTRIM) ophthalmic solution    Sig: Place 1 drop into the right eye every 4 (four) hours.    Dispense:  10 mL    Refill:  0    Order Specific Question:   Supervising Provider    Answer:   Merrilee Jansky [[id number]]     *If you need refills on other medications prior to your next appointment, please contact your pharmacy*  Follow-Up: Call back or seek an in-person evaluation if the symptoms worsen or if the condition fails to improve as anticipated.  [facility] [phone]  Other Instructions Apply cold compresses to eye for comfort.   If you have been instructed to have an in-person evaluation today at a local Urgent Care facility, please use the link below. It will take you to a list of all of our available [facility], including address, phone number and hours of operation. Please do not delay care.  [facility]  If you or a family member do not have a primary care provider, use the link below to schedule a visit and  establish care. When you choose a [facility] primary care physician or advanced practice provider, you gain a long-term partner in health. Find a Primary Care Provider  Learn more about [facility]'s in-office and virtual care options: [facility] Get Care Now

## 2022-09-25 ENCOUNTER — Ambulatory Visit (INDEPENDENT_AMBULATORY_CARE_PROVIDER_SITE_OTHER): Payer: Medicaid Other | Admitting: Nurse Practitioner

## 2022-09-25 ENCOUNTER — Encounter: Payer: Self-pay | Admitting: Nurse Practitioner

## 2022-09-25 VITALS — BP 124/80 | HR 92 | Temp 98.4°F | Ht 69.6 in | Wt 152.0 lb

## 2022-09-25 DIAGNOSIS — R109 Unspecified abdominal pain: Secondary | ICD-10-CM

## 2022-09-25 DIAGNOSIS — R599 Enlarged lymph nodes, unspecified: Secondary | ICD-10-CM

## 2022-09-25 DIAGNOSIS — Z114 Encounter for screening for human immunodeficiency virus [HIV]: Secondary | ICD-10-CM | POA: Diagnosis not present

## 2022-09-25 DIAGNOSIS — R634 Abnormal weight loss: Secondary | ICD-10-CM

## 2022-09-25 DIAGNOSIS — Z8669 Personal history of other diseases of the nervous system and sense organs: Secondary | ICD-10-CM

## 2022-09-25 DIAGNOSIS — R935 Abnormal findings on diagnostic imaging of other abdominal regions, including retroperitoneum: Secondary | ICD-10-CM | POA: Diagnosis not present

## 2022-09-25 DIAGNOSIS — Z1159 Encounter for screening for other viral diseases: Secondary | ICD-10-CM

## 2022-09-25 DIAGNOSIS — H209 Unspecified iridocyclitis: Secondary | ICD-10-CM

## 2022-09-25 MED ORDER — DICYCLOMINE HCL 20 MG PO TABS: 20.0000 mg | ORAL_TABLET | Freq: Four times a day (QID) | ORAL | 0 refills | Status: DC

## 2022-09-25 NOTE — Progress Notes (Signed)
I,[name], CMA,acting as a Neurosurgeon for [employer], FNP.,have documented all relevant documentation on the behalf of Jimmy Felts, FNP,as directed by  Jimmy Felts, FNP while in the presence of Jimmy Felts, FNP.  Subjective:  Patient ID: Jimmy Powers , male    DOB: 16-Dec-1995 , 27 y.o.   MRN: [id number]  Chief Complaint  Patient presents with   Establish Care   Abdominal Pain    HPI  Patient presents today to establish primary care.  Has not had a PCP in the past. He is working in Network engineer at [employer]. Single. No children. He found [place] online after his hospitalization.   He reports his stomach has been hurting on and off since jan. He notices his stomach will hurt after eating pizza, baked macaroni and cheese. Sometimes it hurts without him eating.   Patient reports his vision in the right eye has been cloudy and blurry. Patient reports vision has been worsening over the last 2 years. Occasional nausea.   He was going to Dr. Cathey Endow for ophthalmology and was given eye drops. He was diagnosed with Uveitis.   Father - Diabetes. Mother has 5 children. Father has 18 children. He feels they are all healthy.   Wt Readings from Last 3 Encounters: 09/25/22 : 152 lb (68.9 kg) 06/19/22 : 155 lb (70.3 kg) 04/29/22 : 165 lb (74.8 kg)    Abdominal Pain This is a recurrent problem. The current episode started more than 1 month ago. The onset quality is sudden. The problem occurs every several days. The problem has been gradually improving. The pain is located in the generalized abdominal region. The pain is at a severity of 9/10 (lasting at least an hour). The pain is moderate. The quality of the pain is cramping. The abdominal pain radiates to the right shoulder (right arm "feels funny"). Pertinent negatives include no constipation, diarrhea, nausea or vomiting.     Past Medical History:  Diagnosis Date   Pneumonia      Family History  Problem Relation Age of Onset    Hypertension Mother    Diabetes Father      Current Outpatient Medications:    dicyclomine (BENTYL) 20 MG tablet, Take 1 tablet (20 mg total) by mouth every 6 (six) hours., Disp: 30 tablet, Rfl: 0   trimethoprim-polymyxin b (POLYTRIM) ophthalmic solution, Place 1 drop into the right eye every 4 (four) hours., Disp: 10 mL, Rfl: 0   HYDROcodone-acetaminophen (NORCO/VICODIN) 5-325 MG tablet, Take 1 tablet by mouth every 6 (six) hours as needed., Disp: 12 tablet, Rfl: 0   Allergies  Allergen Reactions   Amoxicillin Rash     Review of Systems  Constitutional: Negative.   Eyes: Negative.   Respiratory: Negative.    Cardiovascular: Negative.   Gastrointestinal:  Positive for abdominal pain. Negative for constipation, diarrhea, nausea and vomiting.  Musculoskeletal: Negative.   Skin: Negative.   Psychiatric/Behavioral: Negative.       Today's Vitals   09/25/22 1537 09/25/22 1549  BP: (!) 136/90 124/80  Pulse: 92   Temp: 98.4 F (36.9 C)   Weight: 152 lb (68.9 kg)   Height: 5' 9.6" (1.768 m)   PainSc: 0-No pain    Body mass index is 22.06 kg/m.  Wt Readings from Last 3 Encounters:  10/01/22 150 lb (68 kg)  09/25/22 152 lb (68.9 kg)  06/19/22 155 lb (70.3 kg)     Objective:  Physical Exam Vitals reviewed.  Constitutional:  General: He is not in acute distress.    Appearance: Normal appearance. He is well-developed.  Cardiovascular:     Rate and Rhythm: Normal rate and regular rhythm.     Pulses: Normal pulses.     Heart sounds: Normal heart sounds. No murmur heard. Pulmonary:     Effort: Pulmonary effort is normal. No respiratory distress.     Breath sounds: Normal breath sounds. No wheezing.  Abdominal:     General: Abdomen is flat. Bowel sounds are normal. There is no distension.     Palpations: Abdomen is soft. There is no mass.     Tenderness: There is no abdominal tenderness. There is no right CVA tenderness, left CVA tenderness, guarding or rebound.      Hernia: No hernia is present.  Skin:    General: Skin is warm and dry.     Capillary Refill: Capillary refill takes less than 2 seconds.  Neurological:     General: No focal deficit present.     Mental Status: He is alert and oriented to person, place, and time.     Cranial Nerves: No cranial nerve deficit.     Motor: No weakness.  Psychiatric:        Mood and Affect: Mood normal.        Behavior: Behavior normal.        Thought Content: Thought content normal.        Judgment: Judgment normal.         Assessment And Plan:  Abdominal pain, unspecified abdominal location Assessment & Plan: Will refer to GI for further evaluation and check iron level  Orders: -     Iron, TIBC and Ferritin Panel -     CT ABDOMEN PELVIS W CONTRAST; Future -     Dicyclomine HCl; Take 1 tablet (20 mg total) by mouth every 6 (six) hours.  Dispense: 30 tablet; Refill: 0 -     Ambulatory referral to Gastroenterology  Abnormal CT of the abdomen Assessment & Plan: Seen on CT scan of abdomen, Large amount of heterogeneous ingested material within a distended stomach which may represent sequelae associated with gastric outlet obstruction. Will refer to GI for further evaluation.   Orders: -     Ambulatory referral to Gastroenterology  Uveitis of right eye -     TSH -     Ambulatory referral to Ophthalmology -     Autoimmune Profile -     C-reactive protein  Abnormal weight loss Assessment & Plan: Will check for metabolic causes.   Orders: -     Hemoglobin A1c -     TSH  Enlarged lymph nodes -     CT ABDOMEN PELVIS W CONTRAST; Future  History of Bell's palsy  Encounter for HIV (human immunodeficiency virus) test -     HIV Antibody (routine testing w rflx)  Encounter for hepatitis C screening test for low risk patient Assessment & Plan: Will check Hepatitis C screening due to recent recommendations to screen all adults 18 years and older   Orders: -     Hepatitis C antibody       Return for schedule HM in 3-4 months.  Patient was given opportunity to ask questions. Patient verbalized understanding of the plan and was able to repeat key elements of the plan. All questions were answered to their satisfaction.  Jimmy Felts, FNP   I, Jimmy Felts, FNP, have reviewed all documentation for this visit. The documentation on 09/25/22 for the  exam, diagnosis, procedures, and orders are all accurate and complete.  IF YOU HAVE BEEN REFERRED TO A SPECIALIST, IT MAY TAKE 1-2 WEEKS TO SCHEDULE/PROCESS THE REFERRAL. IF YOU HAVE NOT HEARD FROM US/SPECIALIST IN TWO WEEKS, PLEASE GIVE [place] A CALL AT [phone].

## 2022-09-26 LAB — AUTOIMMUNE PROFILE
Anti Nuclear Antibody (ANA): NEGATIVE
Complement C3, Serum: 159 mg/dL (ref 82–167)
dsDNA Ab: <1 IU/mL (ref 0–9)

## 2022-09-26 LAB — TSH: TSH: 1 u[IU]/mL (ref 0.450–4.500)

## 2022-09-26 LAB — HEPATITIS C ANTIBODY: Hep C Virus Ab: NONREACTIVE

## 2022-09-26 LAB — IRON,TIBC AND FERRITIN PANEL
Ferritin: 73 ng/mL (ref 30–400)
Iron Saturation: 18 % (ref 15–55)
Iron: 62 ug/dL (ref 38–169)
Total Iron Binding Capacity: 336 ug/dL (ref 250–450)
UIBC: 274 ug/dL (ref 111–343)

## 2022-09-26 LAB — HIV ANTIBODY (ROUTINE TESTING W REFLEX): HIV Screen 4th Generation wRfx: NONREACTIVE

## 2022-09-26 LAB — C-REACTIVE PROTEIN: CRP: 4 mg/L (ref 0–10)

## 2022-09-26 LAB — HEMOGLOBIN A1C
Est. average glucose Bld gHb Est-mCnc: 100 mg/dL
Hgb A1c MFr Bld: 5.1 % (ref 4.8–5.6)

## 2022-10-01 ENCOUNTER — Emergency Department (HOSPITAL_COMMUNITY)
Admission: EM | Admit: 2022-10-01 | Discharge: 2022-10-01 | Disposition: A | Discharge status: Home / Self Care | Payer: 59 | Attending: Emergency Medicine | Admitting: Emergency Medicine

## 2022-10-01 ENCOUNTER — Emergency Department (HOSPITAL_COMMUNITY): Payer: 59

## 2022-10-01 DIAGNOSIS — S80212A Abrasion, left knee, initial encounter: Secondary | ICD-10-CM | POA: Diagnosis not present

## 2022-10-01 DIAGNOSIS — S80211A Abrasion, right knee, initial encounter: Secondary | ICD-10-CM | POA: Diagnosis not present

## 2022-10-01 DIAGNOSIS — S0990XA Unspecified injury of head, initial encounter: Secondary | ICD-10-CM | POA: Insufficient documentation

## 2022-10-01 DIAGNOSIS — S7011XA Contusion of right thigh, initial encounter: Secondary | ICD-10-CM | POA: Insufficient documentation

## 2022-10-01 DIAGNOSIS — R161 Splenomegaly, not elsewhere classified: Secondary | ICD-10-CM | POA: Diagnosis not present

## 2022-10-01 DIAGNOSIS — R102 Pelvic and perineal pain: Secondary | ICD-10-CM | POA: Diagnosis not present

## 2022-10-01 DIAGNOSIS — S60512A Abrasion of left hand, initial encounter: Secondary | ICD-10-CM | POA: Insufficient documentation

## 2022-10-01 DIAGNOSIS — S60511A Abrasion of right hand, initial encounter: Secondary | ICD-10-CM | POA: Diagnosis not present

## 2022-10-01 DIAGNOSIS — Y92410 Unspecified street and highway as the place of occurrence of the external cause: Secondary | ICD-10-CM | POA: Diagnosis not present

## 2022-10-01 DIAGNOSIS — R109 Unspecified abdominal pain: Secondary | ICD-10-CM | POA: Diagnosis not present

## 2022-10-01 DIAGNOSIS — M79651 Pain in right thigh: Secondary | ICD-10-CM | POA: Diagnosis not present

## 2022-10-01 DIAGNOSIS — R079 Chest pain, unspecified: Secondary | ICD-10-CM | POA: Diagnosis not present

## 2022-10-01 DIAGNOSIS — Z041 Encounter for examination and observation following transport accident: Secondary | ICD-10-CM | POA: Diagnosis not present

## 2022-10-01 LAB — CBC WITH DIFFERENTIAL/PLATELET
Abs Immature Granulocytes: 0 10*3/uL (ref 0.00–0.07)
Basophils Absolute: 0.1 10*3/uL (ref 0.0–0.1)
Basophils Relative: 1 %
Eosinophils Absolute: 0.5 10*3/uL (ref 0.0–0.5)
Eosinophils Relative: 15 %
HCT: 45.5 % (ref 39.0–52.0)
Hemoglobin: 14.4 g/dL (ref 13.0–17.0)
Immature Granulocytes: 0 %
Lymphocytes Relative: 17 %
Lymphs Abs: 0.6 10*3/uL — ABNORMAL LOW (ref 0.7–4.0)
MCH: 24.9 pg — ABNORMAL LOW (ref 26.0–34.0)
MCHC: 31.6 g/dL (ref 30.0–36.0)
MCV: 78.7 fL — ABNORMAL LOW (ref 80.0–100.0)
Monocytes Absolute: 0.4 10*3/uL (ref 0.1–1.0)
Monocytes Relative: 13 %
Neutro Abs: 1.9 10*3/uL (ref 1.7–7.7)
Neutrophils Relative %: 54 %
Platelets: 202 10*3/uL (ref 150–400)
RBC: 5.78 MIL/uL (ref 4.22–5.81)
RDW: 14.3 % (ref 11.5–15.5)
WBC: 3.5 10*3/uL — ABNORMAL LOW (ref 4.0–10.5)
nRBC: 0 % (ref 0.0–0.2)

## 2022-10-01 LAB — I-STAT CHEM 8, ED
BUN: 9 mg/dL (ref 6–20)
Calcium, Ion: 1.02 mmol/L — ABNORMAL LOW (ref 1.15–1.40)
Chloride: 105 mmol/L (ref 98–111)
Creatinine, Ser: 1 mg/dL (ref 0.61–1.24)
Glucose, Bld: 101 mg/dL — ABNORMAL HIGH (ref 70–99)
HCT: 45 % (ref 39.0–52.0)
Hemoglobin: 15.3 g/dL (ref 13.0–17.0)
Potassium: 3.4 mmol/L — ABNORMAL LOW (ref 3.5–5.1)
Sodium: 142 mmol/L (ref 135–145)
TCO2: 25 mmol/L (ref 22–32)

## 2022-10-01 LAB — BASIC METABOLIC PANEL
Anion gap: 14 (ref 5–15)
BUN: 7 mg/dL (ref 6–20)
CO2: 24 mmol/L (ref 22–32)
Calcium: 9.2 mg/dL (ref 8.9–10.3)
Chloride: 103 mmol/L (ref 98–111)
Creatinine, Ser: 1.05 mg/dL (ref 0.61–1.24)
GFR, Estimated: >60 mL/min (ref >60)
Glucose, Bld: 101 mg/dL — ABNORMAL HIGH (ref 70–99)
Potassium: 3.4 mmol/L — ABNORMAL LOW (ref 3.5–5.1)
Sodium: 141 mmol/L (ref 135–145)

## 2022-10-01 LAB — TYPE AND SCREEN
ABO/RH(D): B NEG
Antibody Screen: NEGATIVE

## 2022-10-01 LAB — ETHANOL: Alcohol, Ethyl (B): <10 mg/dL (ref <10)

## 2022-10-01 MED ORDER — MORPHINE SULFATE (PF) 4 MG/ML IV SOLN
4.0000 mg | Freq: Once | INTRAVENOUS | Status: AC
  Administered 2022-10-01: 4 mg via INTRAVENOUS
  Filled 2022-10-01: qty 1

## 2022-10-01 MED ORDER — LACTATED RINGERS IV BOLUS
1000.0000 mL | Freq: Once | INTRAVENOUS | Status: AC
  Administered 2022-10-01: 1000 mL via INTRAVENOUS

## 2022-10-01 MED ORDER — HYDROCODONE-ACETAMINOPHEN 5-325 MG PO TABS: 1.0000 | ORAL_TABLET | Freq: Four times a day (QID) | ORAL | 0 refills | Status: DC | PRN

## 2022-10-01 MED ORDER — IOHEXOL 350 MG/ML SOLN
75.0000 mL | Freq: Once | INTRAVENOUS | Status: AC | PRN
  Administered 2022-10-01: 75 mL via INTRAVENOUS
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
## 2022-10-01 NOTE — Progress Notes (Signed)
Orthopedic Tech Progress Note Patient Details:  Jimmy Powers 04-May-1995 [id number] Level 2 not needed Patient ID: [name], male   DOB: [birth year], 27 y.o.   MRN: [id number]  [name] 10/01/2022, 2:13 PM
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
## 2022-10-01 NOTE — Discharge Instructions (Addendum)
Return for any problem.  ?

## 2022-10-07 ENCOUNTER — Telehealth: Payer: Self-pay

## 2022-10-07 NOTE — Transitions of Care (Post Inpatient/ED Visit) (Signed)
   10/07/2022  Name: Jimmy Powers MRN: [id number] DOB: Jan 09, 1996  Today's TOC FU Call Status: Today's TOC FU Call Status:: Successful TOC FU Call Competed TOC FU Call Complete Date: 10/07/22  Transition Care Management Follow-up Telephone Call Date of Discharge: 10/01/22 Discharge Facility: [facility]) Type of Discharge: Emergency Department Reason for ED Visit: Other: How have you been since you were released from the hospital?: Same Any questions or concerns?: No Patient Questions/Concerns Addressed: Provided Patient Educational Materials  Items Reviewed: Did you receive and understand the discharge instructions provided?: Yes Medications obtained,verified, and reconciled?: Yes (Medications Reviewed) Any new allergies since your discharge?: No Dietary orders reviewed?: No Do you have support at home?: Yes  Medications Reviewed Today: Medications Reviewed Today     Reviewed by Arnette Felts, FNP (Family Nurse Practitioner) on 09/25/22 at 1613  Med List Status: <None>   Medication Order Taking? Sig Documenting Provider Last Dose Status Informant    Discontinued 07/20/20 1235   trimethoprim-polymyxin b (POLYTRIM) ophthalmic solution [id number] Yes Place 1 drop into the right eye every 4 (four) hours. Claiborne Rigg, NP Taking Active             Home Care and Equipment/Supplies: Were Home Health Services Ordered?: NA Any new equipment or medical supplies ordered?: NA  Functional Questionnaire: Do you need assistance with bathing/showering or dressing?: No Do you need assistance with meal preparation?: No Do you need assistance with eating?: No Do you have difficulty maintaining continence: No Do you need assistance with getting out of bed/getting out of a chair/moving?: No Do you have difficulty managing or taking your medications?: No  Follow up appointments reviewed: PCP Follow-up appointment confirmed?: Yes Date of PCP follow-up appointment?:  10/09/22 Follow-up Provider: Ellender Hose NP Specialist Hospital Follow-up appointment confirmed?: NA Do you need transportation to your follow-up appointment?: No Do you understand care options if your condition(s) worsen?: Yes-patient verbalized understanding    SIGNATUREYL,RMA

## 2022-10-08 DIAGNOSIS — Z1159 Encounter for screening for other viral diseases: Secondary | ICD-10-CM | POA: Insufficient documentation

## 2022-10-08 DIAGNOSIS — Z114 Encounter for screening for human immunodeficiency virus [HIV]: Secondary | ICD-10-CM | POA: Insufficient documentation

## 2022-10-08 NOTE — Assessment & Plan Note (Signed)
Will check for metabolic causes.  

## 2022-10-08 NOTE — Assessment & Plan Note (Signed)
Seen on CT scan of abdomen, Large amount of heterogeneous ingested material within a distended stomach which may represent sequelae associated with gastric outlet obstruction. Will refer to GI for further evaluation.

## 2022-10-08 NOTE — Assessment & Plan Note (Signed)
Will refer to GI for further evaluation and check iron level

## 2022-10-08 NOTE — Assessment & Plan Note (Signed)
Will refer to ophthalmology and check for any autoimmune causes.

## 2022-10-08 NOTE — Assessment & Plan Note (Signed)
Will check Hepatitis C screening due to recent recommendations to screen all adults 18 years and older  

## 2022-10-09 ENCOUNTER — Encounter: Payer: Self-pay | Admitting: Family Medicine

## 2022-10-09 ENCOUNTER — Ambulatory Visit: Payer: 59 | Admitting: Family Medicine

## 2022-10-09 DIAGNOSIS — R935 Abnormal findings on diagnostic imaging of other abdominal regions, including retroperitoneum: Secondary | ICD-10-CM | POA: Diagnosis not present

## 2022-10-09 DIAGNOSIS — R599 Enlarged lymph nodes, unspecified: Secondary | ICD-10-CM
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
## 2022-10-15 NOTE — Assessment & Plan Note (Signed)
Followed by GI already

## 2022-10-15 NOTE — Assessment & Plan Note (Signed)
Superficial bruises are healing up

## 2022-10-22 ENCOUNTER — Other Ambulatory Visit: Payer: Medicaid Other

## 2022-10-23 DIAGNOSIS — R1084 Generalized abdominal pain: Secondary | ICD-10-CM | POA: Diagnosis not present

## 2023-02-13 ENCOUNTER — Encounter: Payer: Self-pay | Admitting: Nurse Practitioner

## 2023-02-13 ENCOUNTER — Ambulatory Visit (INDEPENDENT_AMBULATORY_CARE_PROVIDER_SITE_OTHER): Payer: 59 | Admitting: Nurse Practitioner

## 2023-02-13 VITALS — BP 108/60 | HR 85 | Temp 97.9°F | Ht 70.0 in | Wt 163.0 lb

## 2023-02-13 DIAGNOSIS — H209 Unspecified iridocyclitis: Secondary | ICD-10-CM

## 2023-02-13 DIAGNOSIS — H5461 Unqualified visual loss, right eye, normal vision left eye: Secondary | ICD-10-CM | POA: Diagnosis not present

## 2023-02-13 DIAGNOSIS — Z2821 Immunization not carried out because of patient refusal: Secondary | ICD-10-CM | POA: Diagnosis not present

## 2023-02-13 DIAGNOSIS — Z0000 Encounter for general adult medical examination without abnormal findings: Secondary | ICD-10-CM

## 2023-02-13 DIAGNOSIS — Z13220 Encounter for screening for lipoid disorders: Secondary | ICD-10-CM | POA: Diagnosis not present

## 2023-02-13 DIAGNOSIS — Z113 Encounter for screening for infections with a predominantly sexual mode of transmission: Secondary | ICD-10-CM | POA: Diagnosis not present

## 2023-02-13 NOTE — Progress Notes (Signed)
Madelaine Bhat, CMA,acting as a Neurosurgeon for Arnette Felts, FNP.,have documented all relevant documentation on the behalf of Arnette Felts, FNP,as directed by  Arnette Felts, FNP while in the presence of Arnette Felts, FNP.  Subjective:   Patient ID: Jimmy Powers , male    DOB: 1995-12-14 , 27 y.o.   MRN: [id number]  Chief Complaint  Patient presents with   Annual Exam    HPI  Patient presents today for HM, Patient reports compliance with medication. Patient denies any chest pain, SOB, or headaches. Patient reports he has very blurry vision in his right eye. He had a referral to ophthalmology in June/July he said Dr Lucious Groves office cancelled the appt. Patient would like to see an eye doctor.      Past Medical History:  Diagnosis Date   Pneumonia      Family History  Problem Relation Age of Onset   Hypertension Mother    Diabetes Father      Current Outpatient Medications:    dicyclomine (BENTYL) 20 MG tablet, Take 1 tablet (20 mg total) by mouth every 6 (six) hours. (Patient not taking: Reported on 10/09/2022), Disp: 30 tablet, Rfl: 0   HYDROcodone-acetaminophen (NORCO/VICODIN) 5-325 MG tablet, Take 1 tablet by mouth every 6 (six) hours as needed. (Patient not taking: Reported on 02/13/2023), Disp: 12 tablet, Rfl: 0   trimethoprim-polymyxin b (POLYTRIM) ophthalmic solution, Place 1 drop into the right eye every 4 (four) hours. (Patient not taking: Reported on 10/09/2022), Disp: 10 mL, Rfl: 0   Allergies  Allergen Reactions   Amoxicillin Rash     Men's preventive visit. Patient Health Questionnaire (PHQ-2) is  Flowsheet Row Office Visit from 02/13/2023 in [facility]  PHQ-2 Total Score 0      Patient is on a Regular diet; eating less junk food and more home cooked meals and fast food. Exercising once a week. Marital status: Single. Relevant history for alcohol use is:  Social History   Substance and Sexual Activity  Alcohol Use Yes   Comment:  occ   Relevant history for tobacco use is:  Social History   Tobacco Use  Smoking Status Never   Passive exposure: Never  Smokeless Tobacco Never  .   Review of Systems  Constitutional: Negative.   HENT: Negative.    Eyes:  Positive for visual disturbance.  Respiratory: Negative.    Cardiovascular: Negative.   Gastrointestinal: Negative.   Endocrine: Negative.   Genitourinary: Negative.   Musculoskeletal: Negative.   Allergic/Immunologic: Negative.   Neurological: Negative.   Hematological: Negative.   Psychiatric/Behavioral: Negative.    All other systems reviewed and are negative.    Today's Vitals   02/13/23 1115  BP: 108/60  Pulse: 85  Temp: 97.9 F (36.6 C)  TempSrc: Oral  Weight: 163 lb (73.9 kg)  Height: 5\' 10"  (1.778 m)  PainSc: 0-No pain   Body mass index is 23.39 kg/m.  Wt Readings from Last 3 Encounters:  02/13/23 163 lb (73.9 kg)  10/09/22 152 lb 9.6 oz (69.2 kg)  10/01/22 150 lb (68 kg)    Objective:  Physical Exam Vitals reviewed.  Constitutional:      General: He is not in acute distress.    Appearance: Normal appearance.  HENT:     Head: Normocephalic and atraumatic.     Right Ear: Tympanic membrane, ear canal and external ear normal. There is no impacted cerumen.     Left Ear: Tympanic membrane, ear canal and  external ear normal. There is no impacted cerumen.     Nose: Nose normal.     Mouth/Throat:     Mouth: Mucous membranes are moist.  Eyes:     Extraocular Movements: Extraocular movements intact.     Conjunctiva/sclera: Conjunctivae normal.     Pupils: Pupils are equal, round, and reactive to light.  Cardiovascular:     Rate and Rhythm: Normal rate and regular rhythm.     Pulses: Normal pulses.     Heart sounds: Normal heart sounds. No murmur heard. Pulmonary:     Effort: Pulmonary effort is normal. No respiratory distress.     Breath sounds: Normal breath sounds.  Abdominal:     General: Abdomen is flat. Bowel sounds are  normal. There is no distension.     Palpations: Abdomen is soft.  Genitourinary:    Prostate: Normal.     Rectum: Guaiac result negative.  Musculoskeletal:        General: No swelling or tenderness. Normal range of motion.     Cervical back: Normal range of motion and neck supple.  Skin:    General: Skin is warm and dry.     Capillary Refill: Capillary refill takes less than 2 seconds.  Neurological:     General: No focal deficit present.     Mental Status: He is alert and oriented to person, place, and time.  Psychiatric:        Mood and Affect: Mood normal.        Behavior: Behavior normal.        Thought Content: Thought content normal.        Judgment: Judgment normal.         Assessment And Plan:    Encounter for annual health examination Assessment & Plan: Behavior modifications discussed and diet history reviewed.   Pt will continue to exercise regularly and modify diet with low GI, plant based foods and decrease intake of processed foods.  Recommend intake of daily multivitamin, Vitamin D, and calcium.  Recommend for preventive screenings, as well as recommend immunizations that include influenza, TDAP   Orders: -     CBC with Differential/Platelet -     CMP14+EGFR  Encounter for screening for lipid disorder -     Lipid panel  Screening for STDs (sexually transmitted diseases) -     Chlamydia/Gonococcus/Trichomonas, NAA -     RPR -     HIV Antibody (routine testing w rflx) -     HSV 1 and 2 Ab, IgG -     Hepatitis B surface antibody,qualitative  COVID-19 vaccination declined Assessment & Plan: Declines covid 19 vaccine. Discussed risk of covid 76 and if he changes her mind about the vaccine to call the office. Education has been provided regarding the importance of this vaccine but patient still declined. Advised may receive this vaccine at local pharmacy or Health Dept.or vaccine clinic. Aware to provide a copy of the vaccination record if obtained from local  pharmacy or Health Dept.  Encouraged to take multivitamin, vitamin d, vitamin c and zinc to increase immune system. Aware can call office if would like to have vaccine here at office. Verbalized acceptance and understanding.    Uveitis of right eye Assessment & Plan: Referral was made at last visit this is a prior diagnosis.  He did not make the eye doctor appointment now we will have to try to find another eye doctor to get him in more quickly as he is having  worsening vision changes.  I expressed the importance for him to keep all appointments especially with a specialist.  Phone number given to eye doctor.  Orders: -     Ambulatory referral to Ophthalmology  Vision loss of right eye Assessment & Plan: Vision is poor in the right eye with Snellen chart test  Orders: -     Hemoglobin A1c -     Ambulatory referral to Ophthalmology     Return for 1 year physical. Patient was given opportunity to ask questions. Patient verbalized understanding of the plan and was able to repeat key elements of the plan. All questions were answered to their satisfaction.   Arnette Felts, FNP  I, Arnette Felts, FNP, have reviewed all documentation for this visit. The documentation on 02/13/23 for the exam, diagnosis, procedures, and orders are all accurate and complete.

## 2023-02-14 LAB — LIPID PANEL
Chol/HDL Ratio: 3.8 ratio (ref 0.0–5.0)
Cholesterol, Total: 122 mg/dL (ref 100–199)
HDL: 32 mg/dL — ABNORMAL LOW (ref >39)
LDL Chol Calc (NIH): 76 mg/dL (ref 0–99)
Triglycerides: 64 mg/dL (ref 0–149)
VLDL Cholesterol Cal: 14 mg/dL (ref 5–40)

## 2023-02-14 LAB — RPR: RPR Ser Ql: NONREACTIVE

## 2023-02-14 LAB — CMP14+EGFR
ALT: 32 [IU]/L (ref 0–44)
AST: 31 [IU]/L (ref 0–40)
Albumin: 4.1 g/dL — ABNORMAL LOW (ref 4.3–5.2)
Alkaline Phosphatase: 180 [IU]/L — ABNORMAL HIGH (ref 44–121)
BUN/Creatinine Ratio: 10 (ref 9–20)
BUN: 9 mg/dL (ref 6–20)
Bilirubin Total: 0.8 mg/dL (ref 0.0–1.2)
CO2: 25 mmol/L (ref 20–29)
Calcium: 9 mg/dL (ref 8.7–10.2)
Chloride: 103 mmol/L (ref 96–106)
Creatinine, Ser: 0.92 mg/dL (ref 0.76–1.27)
Globulin, Total: 3.2 g/dL (ref 1.5–4.5)
Glucose: 82 mg/dL (ref 70–99)
Potassium: 3.9 mmol/L (ref 3.5–5.2)
Sodium: 142 mmol/L (ref 134–144)
Total Protein: 7.3 g/dL (ref 6.0–8.5)
eGFR: 118 mL/min/{1.73_m2} (ref >59)

## 2023-02-14 LAB — HIV ANTIBODY (ROUTINE TESTING W REFLEX): HIV Screen 4th Generation wRfx: NONREACTIVE

## 2023-02-14 LAB — CBC WITH DIFFERENTIAL/PLATELET
Basophils Absolute: 0 10*3/uL (ref 0.0–0.2)
Basos: 1 %
EOS (ABSOLUTE): 0.4 10*3/uL (ref 0.0–0.4)
Eos: 11 %
Hematocrit: 47.2 % (ref 37.5–51.0)
Hemoglobin: 15.1 g/dL (ref 13.0–17.7)
Immature Grans (Abs): 0 10*3/uL (ref 0.0–0.1)
Immature Granulocytes: 0 %
Lymphocytes Absolute: 0.5 10*3/uL — ABNORMAL LOW (ref 0.7–3.1)
Lymphs: 14 %
MCH: 24.8 pg — ABNORMAL LOW (ref 26.6–33.0)
MCHC: 32 g/dL (ref 31.5–35.7)
MCV: 78 fL — ABNORMAL LOW (ref 79–97)
Monocytes Absolute: 0.4 10*3/uL (ref 0.1–0.9)
Monocytes: 13 %
Neutrophils Absolute: 2 10*3/uL (ref 1.4–7.0)
Neutrophils: 61 %
Platelets: 185 10*3/uL (ref 150–450)
RBC: 6.08 x10E6/uL — ABNORMAL HIGH (ref 4.14–5.80)
RDW: 13.8 % (ref 11.6–15.4)
WBC: 3.3 10*3/uL — ABNORMAL LOW (ref 3.4–10.8)

## 2023-02-14 LAB — HSV 1 AND 2 AB, IGG
HSV 1 Glycoprotein G Ab, IgG: REACTIVE — AB
HSV 2 IgG, Type Spec: NONREACTIVE

## 2023-02-14 LAB — HEMOGLOBIN A1C
Est. average glucose Bld gHb Est-mCnc: 111 mg/dL
Hgb A1c MFr Bld: 5.5 % (ref 4.8–5.6)

## 2023-02-14 LAB — HEPATITIS B SURFACE ANTIBODY,QUALITATIVE: Hep B Surface Ab, Qual: NONREACTIVE

## 2023-02-16 LAB — CHLAMYDIA/GONOCOCCUS/TRICHOMONAS, NAA
Chlamydia by NAA: NEGATIVE
Gonococcus by NAA: NEGATIVE
Trich vag by NAA: NEGATIVE

## 2023-02-22 ENCOUNTER — Encounter: Payer: Self-pay | Admitting: Nurse Practitioner

## 2023-02-22 DIAGNOSIS — H5461 Unqualified visual loss, right eye, normal vision left eye: Secondary | ICD-10-CM | POA: Insufficient documentation

## 2023-02-22 DIAGNOSIS — Z2821 Immunization not carried out because of patient refusal: Secondary | ICD-10-CM | POA: Insufficient documentation

## 2023-02-22 DIAGNOSIS — Z0000 Encounter for general adult medical examination without abnormal findings: Secondary | ICD-10-CM | POA: Insufficient documentation

## 2023-02-22 NOTE — Assessment & Plan Note (Signed)
Referral was made at last visit this is a prior diagnosis.  He did not make the eye doctor appointment now we will have to try to find another eye doctor to get him in more quickly as he is having worsening vision changes.  I expressed the importance for him to keep all appointments especially with a specialist.  Phone number given to eye doctor.

## 2023-02-22 NOTE — Assessment & Plan Note (Signed)
Behavior modifications discussed and diet history reviewed.   Pt will continue to exercise regularly and modify diet with low GI, plant based foods and decrease intake of processed foods.  Recommend intake of daily multivitamin, Vitamin D, and calcium.  Recommend for preventive screenings, as well as recommend immunizations that include influenza, TDAP

## 2023-02-22 NOTE — Assessment & Plan Note (Signed)

## 2023-02-22 NOTE — Assessment & Plan Note (Signed)
Vision is poor in the right eye with Snellen chart test

## 2023-03-17 DIAGNOSIS — H3581 Retinal edema: Secondary | ICD-10-CM | POA: Diagnosis not present

## 2023-03-17 DIAGNOSIS — H209 Unspecified iridocyclitis: Secondary | ICD-10-CM | POA: Diagnosis not present

## 2023-03-19 DIAGNOSIS — H209 Unspecified iridocyclitis: Secondary | ICD-10-CM | POA: Diagnosis not present

## 2023-03-19 DIAGNOSIS — H3581 Retinal edema: Secondary | ICD-10-CM | POA: Diagnosis not present

## 2023-03-30 ENCOUNTER — Other Ambulatory Visit: Payer: Self-pay | Admitting: Nurse Practitioner

## 2023-03-30 DIAGNOSIS — H109 Unspecified conjunctivitis: Secondary | ICD-10-CM

## 2023-04-18 DIAGNOSIS — H209 Unspecified iridocyclitis: Secondary | ICD-10-CM | POA: Diagnosis not present

## 2023-05-04 ENCOUNTER — Ambulatory Visit (HOSPITAL_COMMUNITY)
Admission: EM | Admit: 2023-05-04 | Discharge: 2023-05-04 | Disposition: A | Discharge status: Home / Self Care | Payer: 59

## 2023-05-04 ENCOUNTER — Encounter (HOSPITAL_COMMUNITY): Payer: Self-pay

## 2023-05-04 DIAGNOSIS — T7840XA Allergy, unspecified, initial encounter: Secondary | ICD-10-CM | POA: Diagnosis not present

## 2023-05-04 MED ORDER — PREDNISONE 20 MG PO TABS: 40.0000 mg | ORAL_TABLET | Freq: Every day | ORAL | 0 refills | Status: AC

## 2023-05-04 MED ORDER — TRIAMCINOLONE ACETONIDE 0.1 % EX CREA
1.0000 | TOPICAL_CREAM | Freq: Two times a day (BID) | CUTANEOUS | 0 refills | Status: AC
  Filled 2023-11-05: qty 80, 30d supply, fill #0

## 2023-05-04 MED ORDER — METHYLPREDNISOLONE ACETATE 40 MG/ML IJ SUSP
INTRAMUSCULAR | Status: AC
  Filled 2023-05-04: qty 1

## 2023-05-04 MED ORDER — METHYLPREDNISOLONE ACETATE 40 MG/ML IJ SUSP
40.0000 mg | Freq: Once | INTRAMUSCULAR | Status: AC
  Administered 2023-05-04: 40 mg via INTRAMUSCULAR
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
## 2023-05-04 NOTE — ED Triage Notes (Signed)
Patient here today with itching all over body after getting a tattoo on his back a couple weeks ago. He has taken Benadryl with little relief. Patient states that his other tattoos have started to raise up a little.

## 2023-05-04 NOTE — Discharge Instructions (Addendum)
Allergic reaction likely to the dye in recent tattoo ink.  We will treat this with the following: Medrol injection given today. This is a steroid to help with inflammation and pain. Prednisone 40 mg daily for 5 days. Take this in the morning. Start this 05/05/23 Triamcinolone cream twice daily to affected areas for 2 weeks, then as needed for itching/flaking. Do not apply to face or neck If you would like to pursue more tattoos, consider following up with a dermatologist or allergy specialist to be tested for dye allergies. Return to urgent care or PCP if symptoms worsen or fail to resolve.

## 2023-05-05 ENCOUNTER — Telehealth: Payer: Self-pay

## 2023-05-05 NOTE — Transitions of Care (Post Inpatient/ED Visit) (Signed)
   05/05/2023  Name: Jimmy Powers MRN: [id number] DOB: [birth year]  Today's TOC FU Call Status: Today's TOC FU Call Status:: Successful TOC FU Call Completed TOC FU Call Complete Date: 05/05/23 Patient's Name and Date of Birth confirmed.  Transition Care Management Follow-up Telephone Call Date of Discharge: 05/04/23 Discharge Facility: Other ([facility]) Name of Other ([facility]) Discharge Facility: urgent care Type of Discharge: Inpatient Admission Primary Inpatient Discharge Diagnosis:: allergic reaction. How have you been since you were released from the hospital?: Better Any questions or concerns?: No  Items Reviewed: Did you receive and understand the discharge instructions provided?: Yes Medications obtained,verified, and reconciled?: Yes (Medications Reviewed) Any new allergies since your discharge?: Yes Dietary orders reviewed?: No Do you have support at home?: Yes People in Home: parent(s), sibling(s)  Medications Reviewed Today: Medications Reviewed Today     Reviewed by Marlyn Corporal, CMA (Certified Medical Assistant) on 05/05/23 at 1651  Med List Status: <None>   Medication Order Taking? Sig Documenting Provider Last Dose Status Informant  brimonidine (ALPHAGAN) 0.2 % ophthalmic solution [id number] Yes Place 1 drop into both eyes 3 (three) times daily. [provider] Taking Active     Discontinued 07/20/20 1235   dorzolamide-timolol (COSOPT) 2-0.5 % ophthalmic solution [id number] Yes Place 1 drop into both eyes 2 (two) times daily. [provider] Taking Active   predniSONE (DELTASONE) 20 MG tablet [id number] Yes Take 2 tablets (40 mg total) by mouth daily with breakfast for 5 days. Landis Martins, PA-C Taking Active   triamcinolone cream (KENALOG) 0.1 % [id number] Yes Apply 1 Application topically 2 (two) times daily. For 2 weeks, then as needed daily. Do not apply to face or neck Landis Martins, PA-C Taking Active             Home  Care and Equipment/Supplies: Were Home Health Services Ordered?: No Any new equipment or medical supplies ordered?: No  Functional Questionnaire: Do you need assistance with bathing/showering or dressing?: No Do you need assistance with meal preparation?: No Do you need assistance with eating?: No Do you have difficulty maintaining continence: No Do you need assistance with getting out of bed/getting out of a chair/moving?: No Do you have difficulty managing or taking your medications?: No  Follow up appointments reviewed: PCP Follow-up appointment confirmed?: No MD Provider Line Number:[phone] Given: Yes Specialist Hospital Follow-up appointment confirmed?: No Reason Specialist Follow-Up Not Confirmed: Appointment Sceduled by [facility] Calling Clinician Do you need transportation to your follow-up appointment?: No Do you understand care options if your condition(s) worsen?: Yes-patient verbalized understanding    SIGNATURE Lisabeth Devoid, CMA

## 2023-05-21 ENCOUNTER — Other Ambulatory Visit: Payer: Self-pay | Admitting: Nurse Practitioner

## 2023-05-21 DIAGNOSIS — H109 Unspecified conjunctivitis: Secondary | ICD-10-CM

## 2023-06-18 DIAGNOSIS — H538 Other visual disturbances: Secondary | ICD-10-CM | POA: Diagnosis not present

## 2023-06-18 DIAGNOSIS — H209 Unspecified iridocyclitis: Secondary | ICD-10-CM | POA: Diagnosis not present

## 2023-06-18 DIAGNOSIS — H3581 Retinal edema: Secondary | ICD-10-CM | POA: Diagnosis not present

## 2023-07-11 DIAGNOSIS — H209 Unspecified iridocyclitis: Secondary | ICD-10-CM | POA: Diagnosis not present

## 2023-07-12 ENCOUNTER — Other Ambulatory Visit: Payer: Self-pay | Admitting: Nurse Practitioner

## 2023-07-12 DIAGNOSIS — H109 Unspecified conjunctivitis: Secondary | ICD-10-CM

## 2023-07-17 DIAGNOSIS — H44111 Panuveitis, right eye: Secondary | ICD-10-CM | POA: Diagnosis not present

## 2023-07-17 DIAGNOSIS — H40003 Preglaucoma, unspecified, bilateral: Secondary | ICD-10-CM | POA: Diagnosis not present

## 2023-07-17 DIAGNOSIS — H209 Unspecified iridocyclitis: Secondary | ICD-10-CM | POA: Diagnosis not present

## 2023-07-17 DIAGNOSIS — H40043 Steroid responder, bilateral: Secondary | ICD-10-CM | POA: Diagnosis not present

## 2023-07-17 DIAGNOSIS — H3581 Retinal edema: Secondary | ICD-10-CM | POA: Diagnosis not present

## 2023-08-25 ENCOUNTER — Emergency Department (HOSPITAL_COMMUNITY)
Admission: EM | Admit: 2023-08-25 | Discharge: 2023-08-25 | Disposition: A | Discharge status: Home / Self Care | Attending: Emergency Medicine | Admitting: Emergency Medicine

## 2023-08-25 ENCOUNTER — Emergency Department (HOSPITAL_COMMUNITY)

## 2023-08-25 ENCOUNTER — Other Ambulatory Visit: Payer: Self-pay

## 2023-08-25 DIAGNOSIS — R109 Unspecified abdominal pain: Secondary | ICD-10-CM | POA: Insufficient documentation

## 2023-08-25 DIAGNOSIS — K429 Umbilical hernia without obstruction or gangrene: Secondary | ICD-10-CM | POA: Diagnosis not present

## 2023-08-25 DIAGNOSIS — R1030 Lower abdominal pain, unspecified: Secondary | ICD-10-CM | POA: Diagnosis not present

## 2023-08-25 DIAGNOSIS — R161 Splenomegaly, not elsewhere classified: Secondary | ICD-10-CM | POA: Diagnosis not present

## 2023-08-25 DIAGNOSIS — R590 Localized enlarged lymph nodes: Secondary | ICD-10-CM | POA: Diagnosis not present

## 2023-08-25 LAB — BASIC METABOLIC PANEL WITH GFR
Anion gap: 7 (ref 5–15)
BUN: 9 mg/dL (ref 6–20)
CO2: 24 mmol/L (ref 22–32)
Calcium: 8.5 mg/dL — ABNORMAL LOW (ref 8.9–10.3)
Chloride: 106 mmol/L (ref 98–111)
Creatinine, Ser: 0.83 mg/dL (ref 0.61–1.24)
GFR, Estimated: >60 mL/min (ref >60)
Glucose, Bld: 103 mg/dL — ABNORMAL HIGH (ref 70–99)
Potassium: 3.6 mmol/L (ref 3.5–5.1)
Sodium: 137 mmol/L (ref 135–145)

## 2023-08-25 LAB — CBC
HCT: 44.7 % (ref 39.0–52.0)
Hemoglobin: 14.7 g/dL (ref 13.0–17.0)
MCH: 26.6 pg (ref 26.0–34.0)
MCHC: 32.9 g/dL (ref 30.0–36.0)
MCV: 80.8 fL (ref 80.0–100.0)
Platelets: 161 10*3/uL (ref 150–400)
RBC: 5.53 MIL/uL (ref 4.22–5.81)
RDW: 13.7 % (ref 11.5–15.5)
WBC: 2.6 10*3/uL — ABNORMAL LOW (ref 4.0–10.5)
nRBC: 0 % (ref 0.0–0.2)

## 2023-08-25 LAB — URINALYSIS, ROUTINE W REFLEX MICROSCOPIC
Bilirubin Urine: NEGATIVE
Glucose, UA: NEGATIVE mg/dL
Hgb urine dipstick: NEGATIVE
Ketones, ur: NEGATIVE mg/dL
Leukocytes,Ua: NEGATIVE
Nitrite: NEGATIVE
Protein, ur: NEGATIVE mg/dL
Specific Gravity, Urine: 1.018 (ref 1.005–1.030)
pH: 5 (ref 5.0–8.0)

## 2023-08-25 NOTE — ED Triage Notes (Signed)
 Pt reports left flank and groin pain starting yesterday. Denies urinary sx.

## 2023-08-25 NOTE — ED Provider Notes (Signed)
 [facility] AT [facility] Provider Note   CSN: [id number] Arrival date & time: 08/25/23  7829     History  Chief Complaint  Patient presents with   Flank Pain    Jimmy Powers is a 28 y.o. male.  28 year old male presents with left-sided flank pain which began yesterday.  Pain has been colicky in nature.  Denies any prior GU stones.  No testicular pain or swelling.  No hematuria or dysuria.  No fever or chills.  Nothing makes it better or worse no treatments prior to arrival       Home Medications Prior to Admission medications   Medication Sig Start Date End Date Taking? Authorizing Provider  brimonidine (ALPHAGAN) 0.2 % ophthalmic solution Place 1 drop into both eyes 3 (three) times daily. 03/19/23 03/18/24  [provider]  dorzolamide-timolol (COSOPT) 2-0.5 % ophthalmic solution Place 1 drop into both eyes 2 (two) times daily. 03/19/23 03/18/24  [provider]  triamcinolone  cream (KENALOG ) 0.1 % Apply 1 Application topically 2 (two) times daily. For 2 weeks, then as needed daily. Do not apply to face or neck 05/04/23   Lorenzo Romberg A, PA-C  cetirizine  (ZYRTEC  ALLERGY) 10 MG tablet Take 1 tablet (10 mg total) by mouth daily. 06/08/20 07/20/20  Adolph Hoop, PA-C      Allergies    Amoxicillin     Review of Systems   Review of Systems  All other systems reviewed and are negative.   Physical Exam Updated Vital Signs BP 131/89 (BP Location: Left Arm)   Pulse 91   Temp 98.6 F (37 C) (Oral)   Resp 18   Ht 1.803 m (5\' 11" )   Wt 77.1 kg   SpO2 100%   BMI 23.71 kg/m  Physical Exam Vitals and nursing note reviewed.  Constitutional:      General: He is not in acute distress.    Appearance: Normal appearance. He is well-developed. He is not toxic-appearing.  HENT:     Head: Normocephalic and atraumatic.  Eyes:     General: Lids are normal.     Conjunctiva/sclera: Conjunctivae normal.     Pupils: Pupils are equal,  round, and reactive to light.  Neck:     Thyroid : No thyroid  mass.     Trachea: No tracheal deviation.  Cardiovascular:     Rate and Rhythm: Normal rate and regular rhythm.     Heart sounds: Normal heart sounds. No murmur heard.    No gallop.  Pulmonary:     Effort: Pulmonary effort is normal. No respiratory distress.     Breath sounds: Normal breath sounds. No stridor. No decreased breath sounds, wheezing, rhonchi or rales.  Abdominal:     General: There is no distension.     Palpations: Abdomen is soft.     Tenderness: There is no abdominal tenderness. There is no rebound.  Musculoskeletal:        General: No tenderness. Normal range of motion.     Cervical back: Normal range of motion and neck supple.  Skin:    General: Skin is warm and dry.     Findings: No abrasion or rash.  Neurological:     Mental Status: He is alert and oriented to person, place, and time. Mental status is at baseline.     GCS: GCS eye subscore is 4. GCS verbal subscore is 5. GCS motor subscore is 6.     Cranial Nerves: No cranial nerve deficit.  Sensory: No sensory deficit.     Motor: Motor function is intact.  Psychiatric:        Attention and Perception: Attention normal.        Speech: Speech normal.        Behavior: Behavior normal.     ED Results / Procedures / Treatments   Labs (all labs ordered are listed, but only abnormal results are displayed) Labs Reviewed  URINALYSIS, ROUTINE W REFLEX MICROSCOPIC  BASIC METABOLIC PANEL WITH GFR  CBC    EKG None  Radiology No results found.  Procedures Procedures    Medications Ordered in ED Medications - No data to display  ED Course/ Medical Decision Making/ A&P                                 Medical Decision Making Amount and/or Complexity of Data Reviewed Labs: ordered. Radiology: ordered.   Renal CT is negative.  Patient's urine is negative as well 2.  Labs are reassuring.  Will discharge home        Final Clinical  Impression(s) / ED Diagnoses Final diagnoses:  None    Rx / DC Orders ED Discharge Orders     None         Lind Repine, MD 08/25/23 1329

## 2023-08-25 NOTE — Discharge Instructions (Signed)
 Tylenol  and Motrin  as needed for pain.  Return here for any problems

## 2023-08-26 ENCOUNTER — Other Ambulatory Visit: Payer: Self-pay | Admitting: Nurse Practitioner

## 2023-08-26 DIAGNOSIS — H109 Unspecified conjunctivitis: Secondary | ICD-10-CM

## 2023-09-17 DIAGNOSIS — H25813 Combined forms of age-related cataract, bilateral: Secondary | ICD-10-CM | POA: Diagnosis not present

## 2023-09-19 DIAGNOSIS — H209 Unspecified iridocyclitis: Secondary | ICD-10-CM | POA: Diagnosis not present

## 2023-09-19 DIAGNOSIS — H25813 Combined forms of age-related cataract, bilateral: Secondary | ICD-10-CM | POA: Diagnosis not present

## 2023-09-19 DIAGNOSIS — H21543 Posterior synechiae (iris), bilateral: Secondary | ICD-10-CM | POA: Diagnosis not present

## 2023-09-19 DIAGNOSIS — H3581 Retinal edema: Secondary | ICD-10-CM | POA: Diagnosis not present

## 2023-09-19 DIAGNOSIS — H40043 Steroid responder, bilateral: Secondary | ICD-10-CM | POA: Diagnosis not present

## 2023-10-10 DIAGNOSIS — H21543 Posterior synechiae (iris), bilateral: Secondary | ICD-10-CM | POA: Diagnosis not present

## 2023-10-10 DIAGNOSIS — Z1159 Encounter for screening for other viral diseases: Secondary | ICD-10-CM | POA: Diagnosis not present

## 2023-10-10 DIAGNOSIS — H40043 Steroid responder, bilateral: Secondary | ICD-10-CM | POA: Diagnosis not present

## 2023-10-10 DIAGNOSIS — G510 Bell's palsy: Secondary | ICD-10-CM | POA: Diagnosis not present

## 2023-10-10 DIAGNOSIS — H209 Unspecified iridocyclitis: Secondary | ICD-10-CM | POA: Diagnosis not present

## 2023-11-01 DIAGNOSIS — J341 Cyst and mucocele of nose and nasal sinus: Secondary | ICD-10-CM | POA: Diagnosis not present

## 2023-11-01 DIAGNOSIS — H539 Unspecified visual disturbance: Secondary | ICD-10-CM | POA: Diagnosis not present

## 2023-11-01 DIAGNOSIS — D71 Functional disorders of polymorphonuclear neutrophils: Secondary | ICD-10-CM | POA: Diagnosis not present

## 2023-11-01 DIAGNOSIS — G510 Bell's palsy: Secondary | ICD-10-CM | POA: Diagnosis not present

## 2023-11-01 DIAGNOSIS — H209 Unspecified iridocyclitis: Secondary | ICD-10-CM | POA: Diagnosis not present

## 2023-11-01 DIAGNOSIS — D869 Sarcoidosis, unspecified: Secondary | ICD-10-CM | POA: Diagnosis not present

## 2023-11-04 ENCOUNTER — Other Ambulatory Visit (HOSPITAL_COMMUNITY): Payer: Self-pay

## 2023-11-05 ENCOUNTER — Other Ambulatory Visit (HOSPITAL_COMMUNITY): Payer: Self-pay

## 2023-11-05 ENCOUNTER — Other Ambulatory Visit: Payer: Self-pay

## 2023-11-05 MED ORDER — ACETAZOLAMIDE ER 500 MG PO CP12
500.0000 mg | ORAL_CAPSULE | Freq: Two times a day (BID) | ORAL | 1 refills | Status: AC
  Filled 2023-11-05: qty 180, 90d supply, fill #0

## 2023-11-05 MED ORDER — PREDNISOLONE ACETATE 1 % OP SUSP: 1.0000 [drp] | Freq: Two times a day (BID) | OPHTHALMIC | 1 refills | Status: AC

## 2023-11-05 MED ORDER — DORZOLAMIDE HCL-TIMOLOL MAL 2-0.5 % OP SOLN
1.0000 [drp] | Freq: Two times a day (BID) | OPHTHALMIC | 11 refills | Status: AC
  Filled 2023-12-19: qty 10, 50d supply, fill #0

## 2023-11-05 MED ORDER — BRIMONIDINE TARTRATE 0.2 % OP SOLN
1.0000 [drp] | Freq: Three times a day (TID) | OPHTHALMIC | 11 refills | Status: AC
  Filled 2023-12-19: qty 5, 16d supply, fill #0

## 2023-11-05 MED ORDER — FOLIC ACID 1 MG PO TABS
1.0000 mg | ORAL_TABLET | Freq: Every day | ORAL | 3 refills | Status: AC
  Filled 2023-11-05: qty 90, 90d supply, fill #0

## 2023-11-05 MED ORDER — PREDNISONE 10 MG PO TABS
5.0000 mg | ORAL_TABLET | Freq: Every day | ORAL | 0 refills | Status: DC
  Filled 2023-11-05: qty 45, 90d supply, fill #0

## 2023-11-05 MED ORDER — PREDNISOLONE ACETATE 1 % OP SUSP
1.0000 [drp] | Freq: Two times a day (BID) | OPHTHALMIC | 6 refills | Status: AC
  Filled 2023-12-19: qty 5, 25d supply, fill #0

## 2023-11-05 MED ORDER — DORZOLAMIDE HCL-TIMOLOL MAL 2-0.5 % OP SOLN: 1.0000 [drp] | Freq: Two times a day (BID) | OPHTHALMIC | 11 refills | Status: AC

## 2023-11-05 MED ORDER — METHOTREXATE SODIUM 2.5 MG PO TABS
15.0000 mg | ORAL_TABLET | ORAL | 2 refills | Status: AC
  Filled 2023-11-05: qty 24, 28d supply, fill #0

## 2023-11-07 DIAGNOSIS — Z79899 Other long term (current) drug therapy: Secondary | ICD-10-CM | POA: Diagnosis not present

## 2023-11-07 DIAGNOSIS — R7989 Other specified abnormal findings of blood chemistry: Secondary | ICD-10-CM | POA: Diagnosis not present

## 2023-11-07 DIAGNOSIS — H21543 Posterior synechiae (iris), bilateral: Secondary | ICD-10-CM | POA: Diagnosis not present

## 2023-11-07 DIAGNOSIS — H40043 Steroid responder, bilateral: Secondary | ICD-10-CM | POA: Diagnosis not present

## 2023-11-07 DIAGNOSIS — H209 Unspecified iridocyclitis: Secondary | ICD-10-CM | POA: Diagnosis not present

## 2023-11-07 DIAGNOSIS — G510 Bell's palsy: Secondary | ICD-10-CM | POA: Diagnosis not present

## 2023-12-19 ENCOUNTER — Other Ambulatory Visit (HOSPITAL_COMMUNITY): Payer: Self-pay

## 2023-12-22 ENCOUNTER — Other Ambulatory Visit (HOSPITAL_COMMUNITY): Payer: Self-pay

## 2023-12-26 DIAGNOSIS — H40043 Steroid responder, bilateral: Secondary | ICD-10-CM | POA: Diagnosis not present

## 2023-12-26 DIAGNOSIS — Z79899 Other long term (current) drug therapy: Secondary | ICD-10-CM | POA: Diagnosis not present

## 2023-12-26 DIAGNOSIS — D709 Neutropenia, unspecified: Secondary | ICD-10-CM | POA: Diagnosis not present

## 2023-12-26 DIAGNOSIS — H209 Unspecified iridocyclitis: Secondary | ICD-10-CM | POA: Diagnosis not present

## 2023-12-26 DIAGNOSIS — R7989 Other specified abnormal findings of blood chemistry: Secondary | ICD-10-CM | POA: Diagnosis not present

## 2023-12-26 DIAGNOSIS — H21543 Posterior synechiae (iris), bilateral: Secondary | ICD-10-CM | POA: Diagnosis not present

## 2023-12-26 DIAGNOSIS — G510 Bell's palsy: Secondary | ICD-10-CM | POA: Diagnosis not present

## 2024-01-20 ENCOUNTER — Other Ambulatory Visit (HOSPITAL_COMMUNITY): Payer: Self-pay

## 2024-01-21 ENCOUNTER — Other Ambulatory Visit: Payer: Self-pay

## 2024-01-22 ENCOUNTER — Other Ambulatory Visit (HOSPITAL_COMMUNITY): Payer: Self-pay

## 2024-01-28 ENCOUNTER — Encounter (HOSPITAL_COMMUNITY): Payer: Self-pay

## 2024-01-28 ENCOUNTER — Ambulatory Visit (HOSPITAL_COMMUNITY)
Admission: EM | Admit: 2024-01-28 | Discharge: 2024-01-28 | Disposition: A | Discharge status: Home / Self Care | Attending: Emergency Medicine | Admitting: Emergency Medicine

## 2024-01-28 DIAGNOSIS — L089 Local infection of the skin and subcutaneous tissue, unspecified: Secondary | ICD-10-CM | POA: Diagnosis not present

## 2024-01-28 DIAGNOSIS — J988 Other specified respiratory disorders: Secondary | ICD-10-CM

## 2024-01-28 DIAGNOSIS — B9689 Other specified bacterial agents as the cause of diseases classified elsewhere: Secondary | ICD-10-CM

## 2024-01-28 DIAGNOSIS — R0981 Nasal congestion: Secondary | ICD-10-CM | POA: Diagnosis not present

## 2024-01-28 DIAGNOSIS — B9789 Other viral agents as the cause of diseases classified elsewhere: Secondary | ICD-10-CM | POA: Diagnosis not present

## 2024-01-28 DIAGNOSIS — R21 Rash and other nonspecific skin eruption: Secondary | ICD-10-CM

## 2024-01-28 LAB — POC COVID19/FLU A&B COMBO
Covid Antigen, POC: NEGATIVE
Influenza A Antigen, POC: NEGATIVE
Influenza B Antigen, POC: NEGATIVE

## 2024-01-28 MED ORDER — PREDNISONE 20 MG PO TABS: 40.0000 mg | ORAL_TABLET | Freq: Every day | ORAL | 0 refills | Status: AC

## 2024-01-28 MED ORDER — DEXAMETHASONE SOD PHOSPHATE PF 10 MG/ML IJ SOLN
10.0000 mg | Freq: Once | INTRAMUSCULAR | Status: AC
  Administered 2024-01-28: 10 mg via INTRAMUSCULAR

## 2024-01-28 MED ORDER — DOXYCYCLINE HYCLATE 100 MG PO CAPS: 100.0000 mg | ORAL_CAPSULE | Freq: Two times a day (BID) | ORAL | 0 refills | Status: DC

## 2024-01-28 MED ORDER — AZELASTINE HCL 0.1 % NA SOLN: 2.0000 | Freq: Two times a day (BID) | NASAL | 0 refills | Status: DC

## 2024-01-28 NOTE — ED Triage Notes (Signed)
 Patient here today with c/o nasal congestion, sinus pressure, hoarseness, and headache as well as chills and night sweats X 2-3 days. Patient tried taking Benadryl and Ibuprofen  with some relief. Some coworkers have also been sick with sinus congestion.   Patient states that he has also noticed an itchy rash where his tattoos are for the past couple days. Patient has been applying a triamcinolone  cream that he had but has not been helping.

## 2024-01-28 NOTE — Discharge Instructions (Signed)
 Your COVID and flu testing are both negative today.  I believe your congestion and sinus related symptoms are from a viral respiratory illness. You can use azelastine nasal spray twice daily to help with this.  Also recommend over-the-counter Mucinex  as needed for cough and congestion.  You are given an injection of Decadron in clinic today to help with the newly developed a rash.  Tomorrow also start taking 2 tablets of prednisone  once daily for 3 days for additional coverage of this. Start taking doxycycline  twice daily for 10 days for possible bacterial skin infection as well. You can take a daily cetirizine  (Zyrtec ) or loratadine (Claritin) once daily to help with itching and rash as well.  Follow-up with your primary care provider or return here as needed.
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
## 2024-02-06 ENCOUNTER — Other Ambulatory Visit (HOSPITAL_COMMUNITY): Payer: Self-pay

## 2024-02-18 ENCOUNTER — Encounter: Payer: 59 | Admitting: Nurse Practitioner

## 2024-03-29 ENCOUNTER — Ambulatory Visit: Admitting: Nurse Practitioner

## 2024-03-29 ENCOUNTER — Encounter: Payer: Self-pay | Admitting: Nurse Practitioner

## 2024-03-29 VITALS — BP 130/70 | HR 78 | Temp 98.6°F | Ht 71.0 in | Wt 201.0 lb

## 2024-03-29 DIAGNOSIS — H209 Unspecified iridocyclitis: Secondary | ICD-10-CM

## 2024-03-29 DIAGNOSIS — R030 Elevated blood-pressure reading, without diagnosis of hypertension: Secondary | ICD-10-CM | POA: Diagnosis not present

## 2024-03-29 DIAGNOSIS — Z0001 Encounter for general adult medical examination with abnormal findings: Secondary | ICD-10-CM | POA: Diagnosis not present

## 2024-03-29 DIAGNOSIS — Z113 Encounter for screening for infections with a predominantly sexual mode of transmission: Secondary | ICD-10-CM

## 2024-03-29 DIAGNOSIS — Z8669 Personal history of other diseases of the nervous system and sense organs: Secondary | ICD-10-CM

## 2024-03-29 DIAGNOSIS — R635 Abnormal weight gain: Secondary | ICD-10-CM | POA: Diagnosis not present

## 2024-03-29 DIAGNOSIS — Z0000 Encounter for general adult medical examination without abnormal findings: Secondary | ICD-10-CM

## 2024-03-29 DIAGNOSIS — Z13220 Encounter for screening for lipoid disorders: Secondary | ICD-10-CM

## 2024-03-29 DIAGNOSIS — Z13228 Encounter for screening for other metabolic disorders: Secondary | ICD-10-CM

## 2024-03-29 DIAGNOSIS — Z114 Encounter for screening for human immunodeficiency virus [HIV]: Secondary | ICD-10-CM

## 2024-03-29 NOTE — Assessment & Plan Note (Signed)
 Non-compliance with Diamox  due to misunderstanding of necessity. - Continue Diamox  as prescribed for fluid buildup behind the eyes. - Follow up with ophthalmologist on January 16th.

## 2024-03-29 NOTE — Assessment & Plan Note (Addendum)
 Likely due to high salt intake and family history of hypertension. Shortness of breath possibly related to weight gain. - Rechecked blood pressure before leaving the clinic. - Advised to reduce salt intake, especially from processed and frozen foods. - Encouraged increased water intake to at least 64 ounces per day. - Scheduled follow-up in four months to reassess blood pressure.

## 2024-03-29 NOTE — Assessment & Plan Note (Signed)
 Weight gain of 30 pounds since last visit, likely due to dietary habits and lack of exercise. - Checked labs to assess for any underlying issues related to weight gain. - Encouraged regular exercise and mindful eating habits.

## 2024-03-29 NOTE — Patient Instructions (Signed)
 Health Maintenance  Topic Date Due   Hepatitis B Vaccine (1 of 3 - 19+ 3-dose series) Never done   HPV Vaccine (1 - 3-dose SCDM series) Never done   COVID-19 Vaccine (3 - Pfizer risk series) 04/14/2024*   Flu Shot  06/29/2024*   DTaP/Tdap/Td vaccine (2 - Td or Tdap) 08/06/2032   Hepatitis C Screening  Completed   HIV Screening  Completed   Pneumococcal Vaccine  Aged Out   Meningitis B Vaccine  Aged Out  *Topic was postponed. The date shown is not the original due date.    VISIT SUMMARY: Today, you came in for your annual physical exam. We discussed your recent weight gain, your history of uveitis, and your elevated blood pressure. You also received a flu shot at work recently and are considering STD testing.  YOUR PLAN: -UVEITIS, BOTH EYES: Uveitis is an inflammation of the middle layer of the eye. You should continue taking Diamox  as prescribed to manage fluid buildup behind your eyes and follow up with your ophthalmologist on January 16th.  -ELEVATED BLOOD PRESSURE: Your elevated blood pressure is likely due to high salt intake and a family history of hypertension. To manage this, reduce your salt intake, especially from processed and frozen foods, and increase your water intake to at least 64 ounces per day. We will recheck your blood pressure in three months.  -ABNORMAL WEIGHT GAIN: You have gained 30 pounds since your last visit, likely due to your diet and lack of exercise. We have checked your labs to rule out any underlying issues. You are encouraged to engage in regular exercise and adopt mindful eating habits.  INSTRUCTIONS: Please follow up with your ophthalmologist on January 16th for your uveitis. We will recheck your blood pressure in three months. Continue with the lifestyle changes discussed, and if you have any concerns or experience any new symptoms, please contact our office.   Contains text generated by [employer].

## 2024-03-29 NOTE — Assessment & Plan Note (Signed)
 Behavior modifications discussed and diet history reviewed.   Pt will continue to exercise regularly and modify diet with low GI, plant based foods and decrease intake of processed foods.  Recommend intake of daily multivitamin, Vitamin D, and calcium.  Recommend for preventive screenings, as well as recommend immunizations that include influenza, TDAP

## 2024-03-29 NOTE — Progress Notes (Signed)
 LILLETTE Kristeen JINNY Gladis, CMA,acting as a neurosurgeon for Gaines Ada, FNP.,have documented all relevant documentation on the behalf of Gaines Ada, FNP,as directed by  Gaines Ada, FNP while in the presence of Gaines Ada, FNP.  Subjective:   Patient ID: Jimmy Powers , male    DOB: [birth year] , 28 y.o.   MRN: [id number]  Chief Complaint  Patient presents with   Annual Exam    Patient presents today for HM, Patient reports compliance with medication. Patient denies any chest pain, SOB, or headaches. Patient has no concerns today.      HPI  Discussed the use of AI scribe software for clinical note transcription with the patient, who gave verbal consent to proceed.  History of Present Illness Jimmy Powers is a 28 year old male who presents for an annual physical exam.  He has experienced a weight gain of approximately 30 pounds over the past seven months, increasing from 170 pounds to 201 pounds. He attributes this to his diet, which includes a lot of pasta and pot roast, and acknowledges a lack of regular exercise outside of work. He also notes insufficient water intake.  He has a history of uveitis affecting both eyes and is scheduled to see an ophthalmologist on January 16th. He has been prescribed Diamox  for fluid buildup behind his eyes but does not take it regularly. He is also on methotrexate  and was previously using azelastine  nasal spray, which he has stopped.  He reports that he ate foods high in salt, particularly during Thanksgiving. There is a family history of hypertension, including his mother, grandfather, aunts, brother, and sister. He experiences occasional shortness of breath, particularly after physical exertion such as running or climbing stairs.  He received a flu shot at work on Wednesday morning and is considering STD testing, including HIV and syphilis.  No chest pain, difficulty swallowing, constipation, diarrhea, urinary issues, or swelling in his feet or ankles.  Past  Medical History:  Diagnosis Date   Pneumonia      Family History  Problem Relation Age of Onset   Hypertension Mother    Diabetes Father     Current Medications[1]   Allergies[2]   Men's preventive visit. Patient Health Questionnaire (PHQ-2) is  Flowsheet Row Office Visit from 03/29/2024 in [facility]  PHQ-2 Total Score 0  Patient is on a Regular diet. 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 Marital status: Single. Relevant history for alcohol use is:  Social History   Substance and Sexual Activity  Alcohol Use Yes   Comment: occ   Relevant history for tobacco use is: Tobacco Use History[3].   Review of Systems  Constitutional: Negative.   HENT: Negative.    Eyes:  Negative for visual disturbance.  Respiratory: Negative.    Cardiovascular: Negative.   Gastrointestinal: Negative.   Endocrine: Negative.   Genitourinary: Negative.   Musculoskeletal: Negative.   Allergic/Immunologic: Negative.   Neurological: Negative.   Hematological: Negative.   Psychiatric/Behavioral: Negative.    All other systems reviewed and are negative.    Today's Vitals   03/29/24 1211 03/29/24 1534  BP: (!) 130/100 130/70  Pulse: 78   Temp: 98.6 F (37 C)   TempSrc: Oral   Weight: 201 lb (91.2 kg)   Height: 5' 11 (1.803 m)   PainSc: 0-No pain    Body mass index is 28.03 kg/m.  Wt Readings from Last 3 Encounters:  03/29/24 201  lb (91.2 kg)  08/25/23 170 lb (77.1 kg)  05/04/23 155 lb (70.3 kg)    Objective:  Physical Exam Vitals and nursing note reviewed.  Constitutional:      General: He is not in acute distress.    Appearance: Normal appearance.  HENT:     Head: Normocephalic and atraumatic.     Right Ear: Tympanic membrane, ear canal and external ear normal. There is no impacted cerumen.     Left Ear: Tympanic membrane, ear canal and external ear normal. There is no impacted cerumen.      Nose: Nose normal.     Mouth/Throat:     Mouth: Mucous membranes are moist.  Eyes:     Extraocular Movements: Extraocular movements intact.     Conjunctiva/sclera: Conjunctivae normal.     Pupils: Pupils are equal, round, and reactive to light.  Cardiovascular:     Rate and Rhythm: Normal rate and regular rhythm.     Pulses: Normal pulses.     Heart sounds: Normal heart sounds. No murmur heard. Pulmonary:     Effort: Pulmonary effort is normal. No respiratory distress.     Breath sounds: Normal breath sounds.  Abdominal:     General: Abdomen is flat. Bowel sounds are normal. There is no distension.     Palpations: Abdomen is soft.  Genitourinary:    Prostate: Normal.     Rectum: Guaiac result negative.  Musculoskeletal:        General: No swelling or tenderness. Normal range of motion.     Cervical back: Normal range of motion and neck supple.  Skin:    General: Skin is warm and dry.     Capillary Refill: Capillary refill takes less than 2 seconds.  Neurological:     General: No focal deficit present.     Mental Status: He is alert and oriented to person, place, and time.  Psychiatric:        Mood and Affect: Mood normal.        Behavior: Behavior normal.        Thought Content: Thought content normal.        Judgment: Judgment normal.         Assessment And Plan:    Encounter for annual health examination Assessment & Plan: Behavior modifications discussed and diet history reviewed.   Pt will continue to exercise regularly and modify diet with low GI, plant based foods and decrease intake of processed foods.  Recommend intake of daily multivitamin, Vitamin D, and calcium.  Recommend for preventive screenings, as well as recommend immunizations that include influenza, TDAP    History of Bell's palsy  Uveitis of both eyes Assessment & Plan: Non-compliance with Diamox  due to misunderstanding of necessity. - Continue Diamox  as prescribed for fluid buildup behind the  eyes. - Follow up with ophthalmologist on January 16th.  Orders: -     CBC with Differential/Platelet -     CMP14+EGFR  Elevated blood pressure reading without diagnosis of hypertension Assessment & Plan: Likely due to high salt intake and family history of hypertension. Shortness of breath possibly related to weight gain. - Rechecked blood pressure before leaving the clinic. - Advised to reduce salt intake, especially from processed and frozen foods. - Encouraged increased water intake to at least 64 ounces per day. - Scheduled follow-up in four months to reassess blood pressure.   Abnormal weight gain Assessment & Plan: Weight gain of 30 pounds since last visit, likely due to dietary  habits and lack of exercise. - Checked labs to assess for any underlying issues related to weight gain. - Encouraged regular exercise and mindful eating habits.   Encounter for HIV (human immunodeficiency virus) test -     HIV Antibody (routine testing w rflx)  Encounter for screening for metabolic disorder -     Hemoglobin A1c  Encounter for lipid screening for cardiovascular disease -     Lipid panel  Screening for STD (sexually transmitted disease) -     RPR W/RFLX TO RPR TITER, TREPONEMAL AB, SCREEN AND DIAGNOSIS -     Chlamydia/Gonococcus/Trichomonas, NAA     Return for 1 year physical; BP f/u 4 months. Patient was given opportunity to ask questions. Patient verbalized understanding of the plan and was able to repeat key elements of the plan. All questions were answered to their satisfaction.   Gaines Ada, FNP  I, Gaines Ada, FNP, have reviewed all documentation for this visit. The documentation on 03/29/2024 for the exam, diagnosis, procedures, and orders are all accurate and complete.       [1]  Current Outpatient Medications:    brimonidine  (ALPHAGAN ) 0.2 % ophthalmic solution, Place 1 drop into both eyes 3 (three) times daily., Disp: 5 mL, Rfl: 11   dorzolamide -timolol   (COSOPT ) 2-0.5 % ophthalmic solution, Place 1 drop into both eyes 2 (two) times daily., Disp: 10 mL, Rfl: 11   dorzolamide -timolol  (COSOPT ) 2-0.5 % ophthalmic solution, Place 1 drop into both eyes 2 (two) times daily., Disp: 10 mL, Rfl: 11   folic acid  (FOLVITE ) 1 MG tablet, Take 1 tablet (1 mg total) by mouth daily., Disp: 90 tablet, Rfl: 3   methotrexate  (RHEUMATREX) 2.5 MG tablet, Take 6 tablets (15 mg total) by mouth once a week., Disp: 24 tablet, Rfl: 2   prednisoLONE  acetate (PRED FORTE ) 1 % ophthalmic suspension, Place 1 drop into both eyes 2 (two) times daily., Disp: 5 mL, Rfl: 6   prednisoLONE  acetate (PRED FORTE ) 1 % ophthalmic suspension, Place 1 drop into both eyes 2 (two) times daily., Disp: 5 mL, Rfl: 1   triamcinolone  cream (KENALOG ) 0.1 %, Apply 1 Application topically 2 (two) times daily. For 2 weeks, then as needed daily. Do not apply to face or neck, Disp: 480 g, Rfl: 0   acetaZOLAMIDE  ER (DIAMOX ) 500 MG capsule, Take 1 capsule (500 mg total) by mouth 2 (two) times daily., Disp: 180 capsule, Rfl: 1 [2]  Allergies Allergen Reactions   Amoxicillin  Rash  [3]  Social History Tobacco Use  Smoking Status Never   Passive exposure: Never  Smokeless Tobacco Never

## 2024-03-30 LAB — CHLAMYDIA/GONOCOCCUS/TRICHOMONAS, NAA
Chlamydia by NAA: NEGATIVE
Gonococcus by NAA: NEGATIVE
Trich vag by NAA: NEGATIVE

## 2024-03-30 LAB — CMP14+EGFR
ALT: 52 IU/L — ABNORMAL HIGH (ref 0–44)
AST: 36 IU/L (ref 0–40)
Albumin: 4.6 g/dL (ref 4.3–5.2)
Alkaline Phosphatase: 104 IU/L (ref 47–123)
BUN/Creatinine Ratio: 10 (ref 9–20)
BUN: 9 mg/dL (ref 6–20)
Bilirubin Total: 0.8 mg/dL (ref 0.0–1.2)
CO2: 24 mmol/L (ref 20–29)
Calcium: 9.4 mg/dL (ref 8.7–10.2)
Chloride: 104 mmol/L (ref 96–106)
Creatinine, Ser: 0.94 mg/dL (ref 0.76–1.27)
Globulin, Total: 2.9 g/dL (ref 1.5–4.5)
Glucose: 87 mg/dL (ref 70–99)
Potassium: 4.4 mmol/L (ref 3.5–5.2)
Sodium: 143 mmol/L (ref 134–144)
Total Protein: 7.5 g/dL (ref 6.0–8.5)
eGFR: 114 mL/min/1.73

## 2024-03-30 LAB — LIPID PANEL
Chol/HDL Ratio: 3.4 ratio (ref 0.0–5.0)
Cholesterol, Total: 137 mg/dL (ref 100–199)
HDL: 40 mg/dL
LDL Chol Calc (NIH): 85 mg/dL (ref 0–99)
Triglycerides: 57 mg/dL (ref 0–149)
VLDL Cholesterol Cal: 12 mg/dL (ref 5–40)

## 2024-03-30 LAB — CBC WITH DIFFERENTIAL/PLATELET
Basophils Absolute: 0 x10E3/uL (ref 0.0–0.2)
Basos: 1 %
EOS (ABSOLUTE): 0.2 x10E3/uL (ref 0.0–0.4)
Eos: 8 %
Hematocrit: 47.4 % (ref 37.5–51.0)
Hemoglobin: 15.6 g/dL (ref 13.0–17.7)
Immature Grans (Abs): 0 x10E3/uL (ref 0.0–0.1)
Immature Granulocytes: 0 %
Lymphocytes Absolute: 0.7 x10E3/uL (ref 0.7–3.1)
Lymphs: 27 %
MCH: 27 pg (ref 26.6–33.0)
MCHC: 32.9 g/dL (ref 31.5–35.7)
MCV: 82 fL (ref 79–97)
Monocytes Absolute: 0.3 x10E3/uL (ref 0.1–0.9)
Monocytes: 10 %
Neutrophils Absolute: 1.4 x10E3/uL (ref 1.4–7.0)
Neutrophils: 54 %
Platelets: 180 x10E3/uL (ref 150–450)
RBC: 5.78 x10E6/uL (ref 4.14–5.80)
RDW: 13.2 % (ref 11.6–15.4)
WBC: 2.6 x10E3/uL — ABNORMAL LOW (ref 3.4–10.8)

## 2024-03-30 LAB — HEMOGLOBIN A1C
Est. average glucose Bld gHb Est-mCnc: 111 mg/dL
Hgb A1c MFr Bld: 5.5 % (ref 4.8–5.6)

## 2024-03-30 LAB — HIV ANTIBODY (ROUTINE TESTING W REFLEX): HIV Screen 4th Generation wRfx: NONREACTIVE

## 2024-03-30 LAB — SYPHILIS: RPR W/REFLEX TO RPR TITER AND TREPONEMAL ANTIBODIES, TRADITIONAL SCREENING AND DIAGNOSIS ALGORITHM: RPR Ser Ql: NONREACTIVE

## 2024-07-27 ENCOUNTER — Ambulatory Visit: Payer: Self-pay | Admitting: Nurse Practitioner

## 2025-03-30 ENCOUNTER — Encounter: Payer: Self-pay | Admitting: Nurse Practitioner
